# Patient Record
Sex: Male | Born: 1945
Health system: Southern US, Community
[De-identification: ages and names within clinical notes are randomized; demographics above are authoritative.]

## PROBLEM LIST (undated history)

## (undated) ENCOUNTER — Ambulatory Visit (HOSPITAL_COMMUNITY): Admission: EM

## (undated) DIAGNOSIS — G4700 Insomnia, unspecified: Secondary | ICD-10-CM

## (undated) DIAGNOSIS — K649 Unspecified hemorrhoids: Secondary | ICD-10-CM

## (undated) DIAGNOSIS — K5900 Constipation, unspecified: Secondary | ICD-10-CM

## (undated) DIAGNOSIS — R0789 Other chest pain: Secondary | ICD-10-CM

## (undated) DIAGNOSIS — K2980 Duodenitis without bleeding: Secondary | ICD-10-CM

## (undated) DIAGNOSIS — G479 Sleep disorder, unspecified: Secondary | ICD-10-CM

## (undated) DIAGNOSIS — I10 Essential (primary) hypertension: Secondary | ICD-10-CM

## (undated) DIAGNOSIS — K921 Melena: Secondary | ICD-10-CM

## (undated) DIAGNOSIS — F4310 Post-traumatic stress disorder, unspecified: Secondary | ICD-10-CM

## (undated) DIAGNOSIS — H269 Unspecified cataract: Secondary | ICD-10-CM

## (undated) DIAGNOSIS — M25519 Pain in unspecified shoulder: Secondary | ICD-10-CM

## (undated) DIAGNOSIS — Z8639 Personal history of other endocrine, nutritional and metabolic disease: Secondary | ICD-10-CM

## (undated) DIAGNOSIS — L719 Rosacea, unspecified: Secondary | ICD-10-CM

## (undated) DIAGNOSIS — Z860100 Personal history of colon polyps, unspecified: Secondary | ICD-10-CM

## (undated) DIAGNOSIS — Z87442 Personal history of urinary calculi: Secondary | ICD-10-CM

## (undated) HISTORY — DX: Personal history of other endocrine, nutritional and metabolic disease: Z86.39

## (undated) HISTORY — PX: OTHER SURGICAL HISTORY: SHX169

## (undated) HISTORY — DX: Personal history of colon polyps, unspecified: Z86.0100

## (undated) HISTORY — DX: Personal history of colonic polyps: Z86.010

## (undated) HISTORY — DX: Rosacea, unspecified: L71.9

## (undated) HISTORY — DX: Essential (primary) hypertension: I10

## (undated) HISTORY — DX: Other chest pain: R07.89

## (undated) HISTORY — DX: Unspecified cataract: H26.9

## (undated) HISTORY — DX: Personal history of urinary calculi: Z87.442

## (undated) HISTORY — DX: Melena: K92.1

## (undated) HISTORY — DX: Pain in unspecified shoulder: M25.519

## (undated) HISTORY — PX: COLONOSCOPY: SHX174

## (undated) HISTORY — DX: Unspecified hemorrhoids: K64.9

## (undated) HISTORY — DX: Constipation, unspecified: K59.00

## (undated) HISTORY — DX: Insomnia, unspecified: G47.00

## (undated) HISTORY — DX: Duodenitis without bleeding: K29.80

## (undated) HISTORY — DX: Sleep disorder, unspecified: G47.9

## (undated) HISTORY — DX: Post-traumatic stress disorder, unspecified: F43.10

---

## 1999-08-20 ENCOUNTER — Ambulatory Visit (HOSPITAL_COMMUNITY): Admission: RE | Admit: 1999-08-20 | Discharge: 1999-08-20 | Payer: Self-pay | Admitting: Internal Medicine

## 2001-03-04 ENCOUNTER — Encounter: Payer: Self-pay | Admitting: Internal Medicine

## 2001-04-27 ENCOUNTER — Emergency Department (HOSPITAL_COMMUNITY): Admission: EM | Admit: 2001-04-27 | Discharge: 2001-04-27 | Payer: Self-pay | Admitting: Emergency Medicine

## 2001-09-14 HISTORY — PX: LITHOTRIPSY: SUR834

## 2001-12-22 ENCOUNTER — Ambulatory Visit (HOSPITAL_BASED_OUTPATIENT_CLINIC_OR_DEPARTMENT_OTHER): Admission: RE | Admit: 2001-12-22 | Discharge: 2001-12-22 | Payer: Self-pay | Admitting: Urology

## 2001-12-22 ENCOUNTER — Encounter: Payer: Self-pay | Admitting: Urology

## 2001-12-25 ENCOUNTER — Emergency Department (HOSPITAL_COMMUNITY): Admission: EM | Admit: 2001-12-25 | Discharge: 2001-12-25 | Payer: Self-pay | Admitting: Emergency Medicine

## 2003-04-15 HISTORY — PX: SHOULDER SURGERY: SHX246

## 2004-10-20 ENCOUNTER — Ambulatory Visit: Payer: Self-pay | Admitting: Internal Medicine

## 2004-11-06 ENCOUNTER — Ambulatory Visit: Payer: Self-pay | Admitting: Internal Medicine

## 2005-03-20 ENCOUNTER — Encounter: Admission: RE | Admit: 2005-03-20 | Discharge: 2005-03-20 | Payer: Self-pay | Admitting: Internal Medicine

## 2007-09-15 DIAGNOSIS — R0789 Other chest pain: Secondary | ICD-10-CM

## 2007-09-15 HISTORY — DX: Other chest pain: R07.89

## 2008-03-15 ENCOUNTER — Encounter: Admission: RE | Admit: 2008-03-15 | Discharge: 2008-03-15 | Payer: Self-pay | Admitting: Internal Medicine

## 2008-03-26 ENCOUNTER — Telehealth: Payer: Self-pay | Admitting: Internal Medicine

## 2008-05-15 DIAGNOSIS — K649 Unspecified hemorrhoids: Secondary | ICD-10-CM | POA: Insufficient documentation

## 2008-05-15 DIAGNOSIS — G479 Sleep disorder, unspecified: Secondary | ICD-10-CM | POA: Insufficient documentation

## 2008-05-15 DIAGNOSIS — Z860100 Personal history of colon polyps, unspecified: Secondary | ICD-10-CM | POA: Insufficient documentation

## 2008-05-15 DIAGNOSIS — E785 Hyperlipidemia, unspecified: Secondary | ICD-10-CM

## 2008-05-15 DIAGNOSIS — K2980 Duodenitis without bleeding: Secondary | ICD-10-CM | POA: Insufficient documentation

## 2008-05-15 DIAGNOSIS — F4310 Post-traumatic stress disorder, unspecified: Secondary | ICD-10-CM

## 2008-05-15 DIAGNOSIS — K921 Melena: Secondary | ICD-10-CM | POA: Insufficient documentation

## 2008-05-15 DIAGNOSIS — Z87442 Personal history of urinary calculi: Secondary | ICD-10-CM

## 2008-05-16 ENCOUNTER — Ambulatory Visit: Payer: Self-pay | Admitting: Internal Medicine

## 2008-05-16 DIAGNOSIS — D509 Iron deficiency anemia, unspecified: Secondary | ICD-10-CM | POA: Insufficient documentation

## 2008-05-16 LAB — CONVERTED CEMR LAB
Iron: 83 ug/dL (ref 42–165)
Saturation Ratios: 22.4 % (ref 20.0–50.0)
Transferrin: 264.4 mg/dL (ref >=212.0)

## 2008-05-17 ENCOUNTER — Encounter: Payer: Self-pay | Admitting: Internal Medicine

## 2008-05-17 ENCOUNTER — Ambulatory Visit: Payer: Self-pay | Admitting: Internal Medicine

## 2008-05-22 ENCOUNTER — Encounter: Payer: Self-pay | Admitting: Internal Medicine

## 2008-06-07 ENCOUNTER — Ambulatory Visit: Payer: Self-pay | Admitting: Internal Medicine

## 2008-06-07 LAB — CONVERTED CEMR LAB
Fecal Occult Blood: NEGATIVE
OCCULT 1: NEGATIVE
OCCULT 2: NEGATIVE
OCCULT 3: NEGATIVE
OCCULT 4: NEGATIVE
OCCULT 5: NEGATIVE

## 2008-06-08 ENCOUNTER — Telehealth: Payer: Self-pay | Admitting: Internal Medicine

## 2011-01-30 NOTE — Op Note (Signed)
[facility]  Patient:    Tyler Reid, Tyler Reid Visit Number: [id number] MRN: [id number]          Service Type: NES Location: [facility] Attending Physician:  Monica Becton Dictated by:   Claudette Laws, M.D. Proc. Date: 12/22/01 Admit Date:  12/22/2001                             Operative Report  PREOPERATIVE DIAGNOSES: 1. A 9 x 4 mm proximal left ureteral calculus with renal colic. 2. Rule out distal left ureteral stone.  POSTOPERATIVE DIAGNOSIS: 1. A 9 x 4 mm proximal left ureteral calculus with renal colic. 2. Rule out distal left ureteral stone.  OPERATION:  Cystoscopy, left retrograde pyeloureterogram, and insertion of a 6 French 26 cm double-J stent.  SURGEON:  Claudette Laws, M.D.  DESCRIPTION OF PROCEDURE:  The patient was prepped and draped in the dorsal lithotomy position under LMA anesthesia.  Cystoscopy was performed.  He had a normal anterior urethra, a small nonobstructing prostate, some elevation of the posterior lip, otherwise a smooth bladder.  No tumors, no calculi, normal ureteral orifices.  DESCRIPTION OF PROCEDURE:  Initially, I passed up a .038 Glidewire through a 6 [place] open-ended ureteral catheter.  It appeared that the calcification I was concerned about over the sacrum was medial to the Glidewire.  We then took retrograde pyelogram studies showing a mild to moderate left hydroureteronephrosis.  Again, I thought the stone in question was in a medial location.  We then passed up a 6 French 26 cm double-J stent with the string attached.  This was positioned in the left renal pelvis, and the distal end was curled up in the bladder.  The string exited out the urethra.  We emptied the bladder, taped the string to the penis, and then inserted a B&O suppository per rectum for bladder spasms.  The patient was then taken back to the recovery room in stable condition for plans for lithotripsy later on in the day. Dictated by:    Claudette Laws, M.D. Attending Physician:  Monica Becton DD:  12/22/01 TD:  12/22/01 Job: [phone]

## 2011-10-19 DIAGNOSIS — I10 Essential (primary) hypertension: Secondary | ICD-10-CM | POA: Diagnosis not present

## 2011-10-19 DIAGNOSIS — R05 Cough: Secondary | ICD-10-CM | POA: Diagnosis not present

## 2011-11-06 DIAGNOSIS — J3089 Other allergic rhinitis: Secondary | ICD-10-CM | POA: Diagnosis not present

## 2011-11-06 DIAGNOSIS — K219 Gastro-esophageal reflux disease without esophagitis: Secondary | ICD-10-CM | POA: Diagnosis not present

## 2011-11-06 DIAGNOSIS — J45909 Unspecified asthma, uncomplicated: Secondary | ICD-10-CM | POA: Diagnosis not present

## 2011-11-06 DIAGNOSIS — J209 Acute bronchitis, unspecified: Secondary | ICD-10-CM | POA: Diagnosis not present

## 2011-11-06 DIAGNOSIS — R05 Cough: Secondary | ICD-10-CM | POA: Diagnosis not present

## 2011-11-24 DIAGNOSIS — D235 Other benign neoplasm of skin of trunk: Secondary | ICD-10-CM | POA: Diagnosis not present

## 2012-03-09 DIAGNOSIS — J45909 Unspecified asthma, uncomplicated: Secondary | ICD-10-CM | POA: Diagnosis not present

## 2012-03-09 DIAGNOSIS — K219 Gastro-esophageal reflux disease without esophagitis: Secondary | ICD-10-CM | POA: Diagnosis not present

## 2012-03-09 DIAGNOSIS — J3089 Other allergic rhinitis: Secondary | ICD-10-CM | POA: Diagnosis not present

## 2012-05-02 DIAGNOSIS — H524 Presbyopia: Secondary | ICD-10-CM | POA: Diagnosis not present

## 2012-05-02 DIAGNOSIS — H2510 Age-related nuclear cataract, unspecified eye: Secondary | ICD-10-CM | POA: Diagnosis not present

## 2012-05-06 DIAGNOSIS — E782 Mixed hyperlipidemia: Secondary | ICD-10-CM | POA: Diagnosis not present

## 2012-05-06 DIAGNOSIS — Z125 Encounter for screening for malignant neoplasm of prostate: Secondary | ICD-10-CM | POA: Diagnosis not present

## 2012-05-06 DIAGNOSIS — I10 Essential (primary) hypertension: Secondary | ICD-10-CM | POA: Diagnosis not present

## 2012-05-13 DIAGNOSIS — Z23 Encounter for immunization: Secondary | ICD-10-CM | POA: Diagnosis not present

## 2012-05-13 DIAGNOSIS — Z0000 Encounter for general adult medical examination without abnormal findings: Secondary | ICD-10-CM | POA: Diagnosis not present

## 2012-05-13 DIAGNOSIS — Z125 Encounter for screening for malignant neoplasm of prostate: Secondary | ICD-10-CM | POA: Diagnosis not present

## 2012-05-13 DIAGNOSIS — J45909 Unspecified asthma, uncomplicated: Secondary | ICD-10-CM | POA: Diagnosis not present

## 2012-05-13 DIAGNOSIS — F329 Major depressive disorder, single episode, unspecified: Secondary | ICD-10-CM | POA: Diagnosis not present

## 2012-05-13 DIAGNOSIS — I10 Essential (primary) hypertension: Secondary | ICD-10-CM | POA: Diagnosis not present

## 2012-05-17 DIAGNOSIS — Z1212 Encounter for screening for malignant neoplasm of rectum: Secondary | ICD-10-CM | POA: Diagnosis not present

## 2012-06-14 DIAGNOSIS — H2510 Age-related nuclear cataract, unspecified eye: Secondary | ICD-10-CM | POA: Diagnosis not present

## 2012-06-30 DIAGNOSIS — J0100 Acute maxillary sinusitis, unspecified: Secondary | ICD-10-CM | POA: Diagnosis not present

## 2012-06-30 DIAGNOSIS — J209 Acute bronchitis, unspecified: Secondary | ICD-10-CM | POA: Diagnosis not present

## 2012-07-06 DIAGNOSIS — M545 Low back pain: Secondary | ICD-10-CM | POA: Diagnosis not present

## 2012-07-12 DIAGNOSIS — M545 Low back pain: Secondary | ICD-10-CM | POA: Diagnosis not present

## 2012-08-01 DIAGNOSIS — H269 Unspecified cataract: Secondary | ICD-10-CM | POA: Diagnosis not present

## 2012-08-01 DIAGNOSIS — H2510 Age-related nuclear cataract, unspecified eye: Secondary | ICD-10-CM | POA: Diagnosis not present

## 2012-08-02 DIAGNOSIS — H2510 Age-related nuclear cataract, unspecified eye: Secondary | ICD-10-CM | POA: Diagnosis not present

## 2012-08-15 DIAGNOSIS — H269 Unspecified cataract: Secondary | ICD-10-CM | POA: Diagnosis not present

## 2012-08-15 DIAGNOSIS — H2510 Age-related nuclear cataract, unspecified eye: Secondary | ICD-10-CM | POA: Diagnosis not present

## 2012-11-19 DIAGNOSIS — J309 Allergic rhinitis, unspecified: Secondary | ICD-10-CM | POA: Diagnosis not present

## 2012-11-19 DIAGNOSIS — R1013 Epigastric pain: Secondary | ICD-10-CM | POA: Diagnosis not present

## 2012-11-19 DIAGNOSIS — J0100 Acute maxillary sinusitis, unspecified: Secondary | ICD-10-CM | POA: Diagnosis not present

## 2012-11-23 DIAGNOSIS — D235 Other benign neoplasm of skin of trunk: Secondary | ICD-10-CM | POA: Diagnosis not present

## 2012-11-29 DIAGNOSIS — H04129 Dry eye syndrome of unspecified lacrimal gland: Secondary | ICD-10-CM | POA: Diagnosis not present

## 2012-12-05 DIAGNOSIS — H04129 Dry eye syndrome of unspecified lacrimal gland: Secondary | ICD-10-CM | POA: Diagnosis not present

## 2013-01-16 DIAGNOSIS — L719 Rosacea, unspecified: Secondary | ICD-10-CM | POA: Insufficient documentation

## 2013-01-30 DIAGNOSIS — Z961 Presence of intraocular lens: Secondary | ICD-10-CM | POA: Diagnosis not present

## 2013-01-30 DIAGNOSIS — H26499 Other secondary cataract, unspecified eye: Secondary | ICD-10-CM | POA: Diagnosis not present

## 2013-01-30 DIAGNOSIS — H18419 Arcus senilis, unspecified eye: Secondary | ICD-10-CM | POA: Diagnosis not present

## 2013-02-13 DIAGNOSIS — Z961 Presence of intraocular lens: Secondary | ICD-10-CM | POA: Diagnosis not present

## 2013-02-13 DIAGNOSIS — H26499 Other secondary cataract, unspecified eye: Secondary | ICD-10-CM | POA: Diagnosis not present

## 2013-03-07 DIAGNOSIS — J3089 Other allergic rhinitis: Secondary | ICD-10-CM | POA: Diagnosis not present

## 2013-03-07 DIAGNOSIS — J45909 Unspecified asthma, uncomplicated: Secondary | ICD-10-CM | POA: Diagnosis not present

## 2013-03-07 DIAGNOSIS — K219 Gastro-esophageal reflux disease without esophagitis: Secondary | ICD-10-CM | POA: Diagnosis not present

## 2013-04-12 DIAGNOSIS — S61209A Unspecified open wound of unspecified finger without damage to nail, initial encounter: Secondary | ICD-10-CM | POA: Diagnosis not present

## 2013-04-17 DIAGNOSIS — H02839 Dermatochalasis of unspecified eye, unspecified eyelid: Secondary | ICD-10-CM | POA: Diagnosis not present

## 2013-04-27 DIAGNOSIS — H02839 Dermatochalasis of unspecified eye, unspecified eyelid: Secondary | ICD-10-CM | POA: Diagnosis not present

## 2013-05-05 DIAGNOSIS — H5340 Unspecified visual field defects: Secondary | ICD-10-CM | POA: Diagnosis not present

## 2013-05-05 DIAGNOSIS — Q388 Other congenital malformations of pharynx: Secondary | ICD-10-CM | POA: Diagnosis not present

## 2013-05-05 DIAGNOSIS — H6120 Impacted cerumen, unspecified ear: Secondary | ICD-10-CM | POA: Diagnosis not present

## 2013-05-05 DIAGNOSIS — H04129 Dry eye syndrome of unspecified lacrimal gland: Secondary | ICD-10-CM | POA: Diagnosis not present

## 2013-05-05 DIAGNOSIS — H02839 Dermatochalasis of unspecified eye, unspecified eyelid: Secondary | ICD-10-CM | POA: Diagnosis not present

## 2013-05-12 DIAGNOSIS — Z23 Encounter for immunization: Secondary | ICD-10-CM | POA: Diagnosis not present

## 2013-05-12 DIAGNOSIS — I10 Essential (primary) hypertension: Secondary | ICD-10-CM | POA: Diagnosis not present

## 2013-05-12 DIAGNOSIS — Z125 Encounter for screening for malignant neoplasm of prostate: Secondary | ICD-10-CM | POA: Diagnosis not present

## 2013-05-12 DIAGNOSIS — E782 Mixed hyperlipidemia: Secondary | ICD-10-CM | POA: Diagnosis not present

## 2013-05-15 HISTORY — PX: OTHER SURGICAL HISTORY: SHX169

## 2013-05-19 DIAGNOSIS — H02839 Dermatochalasis of unspecified eye, unspecified eyelid: Secondary | ICD-10-CM | POA: Diagnosis not present

## 2013-05-19 DIAGNOSIS — H5340 Unspecified visual field defects: Secondary | ICD-10-CM | POA: Diagnosis not present

## 2013-05-22 DIAGNOSIS — Z125 Encounter for screening for malignant neoplasm of prostate: Secondary | ICD-10-CM | POA: Diagnosis not present

## 2013-05-22 DIAGNOSIS — E291 Testicular hypofunction: Secondary | ICD-10-CM | POA: Diagnosis not present

## 2013-05-22 DIAGNOSIS — J45909 Unspecified asthma, uncomplicated: Secondary | ICD-10-CM | POA: Diagnosis not present

## 2013-05-22 DIAGNOSIS — F329 Major depressive disorder, single episode, unspecified: Secondary | ICD-10-CM | POA: Diagnosis not present

## 2013-05-22 DIAGNOSIS — Z1331 Encounter for screening for depression: Secondary | ICD-10-CM | POA: Diagnosis not present

## 2013-05-22 DIAGNOSIS — M545 Low back pain: Secondary | ICD-10-CM | POA: Diagnosis not present

## 2013-05-22 DIAGNOSIS — Z0000 Encounter for general adult medical examination without abnormal findings: Secondary | ICD-10-CM | POA: Diagnosis not present

## 2013-05-22 DIAGNOSIS — D649 Anemia, unspecified: Secondary | ICD-10-CM | POA: Diagnosis not present

## 2013-05-22 DIAGNOSIS — G4733 Obstructive sleep apnea (adult) (pediatric): Secondary | ICD-10-CM | POA: Diagnosis not present

## 2013-05-22 DIAGNOSIS — I10 Essential (primary) hypertension: Secondary | ICD-10-CM | POA: Diagnosis not present

## 2013-05-23 DIAGNOSIS — Z1212 Encounter for screening for malignant neoplasm of rectum: Secondary | ICD-10-CM | POA: Diagnosis not present

## 2013-06-14 ENCOUNTER — Telehealth: Payer: Self-pay | Admitting: Internal Medicine

## 2013-06-15 ENCOUNTER — Encounter: Payer: Self-pay | Admitting: *Deleted

## 2013-06-15 NOTE — Telephone Encounter (Signed)
Scheduled patient on 06/16/13 at 10:00 AM with Mike Gip, PA.Malachi Bonds notified and she will notified patient.

## 2013-06-16 ENCOUNTER — Ambulatory Visit (INDEPENDENT_AMBULATORY_CARE_PROVIDER_SITE_OTHER): Payer: Medicare Other | Admitting: Physician Assistant

## 2013-06-16 ENCOUNTER — Encounter: Payer: Self-pay | Admitting: Physician Assistant

## 2013-06-16 VITALS — BP 100/70 | HR 60 | Ht 70.0 in | Wt 177.5 lb

## 2013-06-16 DIAGNOSIS — Z860100 Personal history of colon polyps, unspecified: Secondary | ICD-10-CM

## 2013-06-16 DIAGNOSIS — R634 Abnormal weight loss: Secondary | ICD-10-CM | POA: Diagnosis not present

## 2013-06-16 DIAGNOSIS — R195 Other fecal abnormalities: Secondary | ICD-10-CM

## 2013-06-16 MED ORDER — MOVIPREP 100 G PO SOLR: 1.0000 | Freq: Once | ORAL | Status: DC

## 2013-06-16 NOTE — Patient Instructions (Addendum)
You have been scheduled for a colonoscopy with propofol. Please follow written instructions given to you at your visit today.  Please pick up your prep kit at the pharmacy within the next 1-3 days. [facility] [address]  If you use inhalers (even only as needed), please bring them with you on the day of your procedure.

## 2013-06-16 NOTE — Progress Notes (Signed)
Reviewed and agree. Please obtain recent labs from Dr Eloise Harman ( CBC, C-met),

## 2013-06-16 NOTE — Progress Notes (Signed)
Subjective:    Patient ID: Tyler Reid, male    DOB: 05-02-1946, 67 y.o.   MRN: [id number]  HPI  Tyler Reid is a pleasant 67 year old white male known to Dr. Vincent Gros  who is referred today by Dr. Jarome Matin regarding Hemoccult-positive stool found at the time of recent physical. Patient had previous EGD done in September of 2009 for Hemoccult-positive stool and anemia and was found to have a moderate duodenitis. Colonoscopy was done in 2006 and was a normal exam. Patient does have prior history of adenomatous colon polyps and had colonoscopy in 1996 and in 2000. He currently denies any abdominal pain. He does take omeprazole daily of 4 was felt to be reflux induced asthma symptoms. He has no complaints of dysphagia or odynophagia no heartburn or indigestion. He says he tends to stay more on the constipated side and occasionally uses a laxative. He takes magnesium supplement daily as well. He says intermittently he will see a small amount of blood just on the tissue but has never seen any blood mixed in with his bowel movements nor any melena. He has had a 10-12 pound weight loss over the past 6 months which has been unintentional and says he has been having occasional bouts of weakness as well. His appetite has been fine. He denies any regular aspirin or NSAID use He reports that he was found to have protein in his urine also with recent physical which is being followed, labs from 05/12/2013 WBC of 4.8 hemoglobin 14.4 hematocrit of 43.8 cholesterol elevated at 227    Review of Systems  Constitutional: Negative.   HENT: Negative.   Eyes: Negative.   Respiratory: Negative.   Cardiovascular: Negative.   Gastrointestinal: Positive for anal bleeding.  Endocrine: Negative.   Genitourinary: Negative.   Musculoskeletal: Negative.   Skin: Negative.   Allergic/Immunologic: Negative.   Neurological: Negative.   Hematological: Negative.   Psychiatric/Behavioral: Negative.    Outpatient Encounter  Prescriptions as of 06/16/2013  Medication Sig Dispense Refill  . BEPREVE 1.5 % SOLN       . omeprazole (PRILOSEC) 20 MG capsule       . MOVIPREP 100 G SOLR Take 1 kit (200 g total) by mouth once. "Pharmacist please use BIN: [id number] GROUP: [id number] ID: [id number] Call [phone] for pharmacy questions "Pt will save $10"  1 kit  0   No facility-administered encounter medications on file as of 06/16/2013.   No Known Allergies Patient Active Problem List   Diagnosis Date Noted  . UNSPECIFIED IRON DEFICIENCY ANEMIA 05/16/2008  . HYPERLIPIDEMIA 05/15/2008  . POSTTRAUMATIC STRESS DISORDER 05/15/2008  . HEMORRHOIDS 05/15/2008  . DUODENITIS, WITHOUT HEMORRHAGE 05/15/2008  . HEMOCCULT POSITIVE STOOL 05/15/2008  . SLEEP DISORDER 05/15/2008  . COLONIC POLYPS, ADENOMATOUS, HX OF 05/15/2008  . NEPHROLITHIASIS, HX OF 05/15/2008   History  Substance Use Topics  . Smoking status: Never Smoker   . Smokeless tobacco: Never Used  . Alcohol Use: Yes     Comment: Rare   family history includes Breast cancer in his mother; Melanoma in his father.     Objective:   Physical Exam  older white male in no acute distress, pleasant blood pressure 100/70 pulse 60 height 5 foot 10 weight 177. HEENT ;nontraumatic normocephalic EOMI PERRLA sclera anicteric, Supple; no JVD, Cardiovascular; regular rate and rhythm with S1-S2 no murmur or gallop, Pulmonary; clear bilaterally, Abdomen; soft nontender nondistended bowel sounds are active there is no palpable mass or hepatosplenomegaly, Rectal ;exam not  done he was recently documented Hemoccult positive, Extremities; no clubbing cyanosis or edema skin warm and dry, Psych ;mood and affect normal and appropriate.        Assessment & Plan:  #70  67 year old male with Hemoccult positive stool-etiology not clear Will need to rule out occult colonic or gastric lesion Last colonoscopy 2006 normal, however patient does have history of adenomatous colon polyps prior  to that Patient also has history of an active duodenitis on endoscopy September 2009 #2 weight loss #3 reflux induced asthma-controlled on omeprazole #4 hyperlipidemia #5 PTSD  Plan; Patient is scheduled for colonoscopy and EGD with Dr. Lillia Corporal were discussed in detail with the patient and he is agreeable to proceed He will continue omeprazole 40 mg by mouth every morning. Further plans pending findings of endoscopic evaluation

## 2013-06-29 ENCOUNTER — Ambulatory Visit (AMBULATORY_SURGERY_CENTER): Payer: Medicare Other | Admitting: Internal Medicine

## 2013-06-29 ENCOUNTER — Encounter: Payer: Self-pay | Admitting: Internal Medicine

## 2013-06-29 ENCOUNTER — Other Ambulatory Visit (INDEPENDENT_AMBULATORY_CARE_PROVIDER_SITE_OTHER): Payer: Medicare Other

## 2013-06-29 ENCOUNTER — Other Ambulatory Visit: Payer: Self-pay

## 2013-06-29 VITALS — BP 148/82 | HR 50 | Temp 96.5°F | Resp 23 | Ht 70.0 in | Wt 177.0 lb

## 2013-06-29 DIAGNOSIS — D1330 Benign neoplasm of unspecified part of small intestine: Secondary | ICD-10-CM

## 2013-06-29 DIAGNOSIS — R195 Other fecal abnormalities: Secondary | ICD-10-CM

## 2013-06-29 DIAGNOSIS — R1012 Left upper quadrant pain: Secondary | ICD-10-CM

## 2013-06-29 DIAGNOSIS — R634 Abnormal weight loss: Secondary | ICD-10-CM

## 2013-06-29 DIAGNOSIS — K2980 Duodenitis without bleeding: Secondary | ICD-10-CM

## 2013-06-29 DIAGNOSIS — Z86010 Personal history of colonic polyps: Secondary | ICD-10-CM | POA: Diagnosis not present

## 2013-06-29 DIAGNOSIS — E785 Hyperlipidemia, unspecified: Secondary | ICD-10-CM | POA: Diagnosis not present

## 2013-06-29 DIAGNOSIS — D126 Benign neoplasm of colon, unspecified: Secondary | ICD-10-CM | POA: Diagnosis not present

## 2013-06-29 DIAGNOSIS — Z1211 Encounter for screening for malignant neoplasm of colon: Secondary | ICD-10-CM | POA: Diagnosis not present

## 2013-06-29 LAB — CREATININE, SERUM: Creatinine, Ser: 1 mg/dL (ref 0.4–1.5)

## 2013-06-29 MED ORDER — SODIUM CHLORIDE 0.9 % IV SOLN: 500.0000 mL | INTRAVENOUS | Status: DC

## 2013-06-29 NOTE — Patient Instructions (Addendum)

## 2013-06-29 NOTE — Progress Notes (Signed)
Called to room to assist during endoscopic procedure.  Patient ID and intended procedure confirmed with present staff. Received instructions for my participation in the procedure from the performing physician.  

## 2013-06-29 NOTE — Progress Notes (Signed)
Patient did not experience any of the following events: a burn prior to discharge; a fall within the facility; wrong site/side/patient/procedure/implant event; or a hospital transfer or hospital admission upon discharge from the facility. (G8907) Patient did not have preoperative order for IV antibiotic SSI prophylaxis. (G8918)  

## 2013-06-29 NOTE — Op Note (Signed)
[facility] [address] [place], [address]   COLONOSCOPY PROCEDURE REPORT  PATIENT: Tyler, Reid  MR#: [id number] BIRTHDATE: 1946/06/21 , 67  yrs. old GENDER: Male ENDOSCOPIST: Hart Carwin, MD REFERRED XB:JYNWGN Eloise Harman, M.D. PROCEDURE DATE:  06/29/2013 PROCEDURE:   Colonoscopy with snare polypectomy First Screening Colonoscopy - Avg.  risk and is 50 yrs.  old or older - No.  Prior Negative Screening - Now for repeat screening. N/A  History of Adenoma - Now for follow-up colonoscopy & has been > or = to 3 yrs.  Yes hx of adenoma.  Has been 3 or more years since last colonoscopy.  Polyps Removed Today? Yes. ASA CLASS:   Class II INDICATIONS:heme positive stool, history of adenomatous polyp in 1992. Normal colonoscopy in 1996, 2008 2006 , hx of anal fissure MEDICATIONS: There was residual sedation effect present from prior procedure.  DESCRIPTION OF PROCEDURE:   After the risks benefits and alternatives of the procedure were thoroughly explained, informed consent was obtained.  A digital rectal exam revealed no abnormalities of the rectum.   The LB [id number]  endoscope was introduced through the anus and advanced to the cecum, which was identified by both the appendix and ileocecal valve. No adverse events experienced.   The quality of the prep was good, using MoviPrep  The instrument was then slowly withdrawn as the colon was fully examined.      COLON FINDINGS: Two polypoid shaped sessile polyps ranging between 3-18mm in size were found in the sigmoid colon.at 20 and 60  cm.  A polypectomy was performed with a cold snare.  The resection was complete and the polyp tissue was completely retrieved. Retroflexed views revealed no abnormalities. The time to cecum=6 minutes 14 seconds.  Withdrawal time=7 minutes 28 seconds.  The scope was withdrawn and the procedure completed.There was no evidence of anal fissure COMPLICATIONS: There were no  complications.  ENDOSCOPIC IMPRESSION: Two sessile polyps ranging between 3-98mm in size were found in the sigmoid colon; polypectomy was performed with a cold snare ,nothing to account for heme positive stool. Duodenitis may be the cause although there is no evidence of active bleeding, the polyps are not likely causing occult GI blood loss, anal or rectal source may be a possible cause for heme positive stool  RECOMMENDATIONS: 1.  Await pathology results 2.  High fiber diet 3.   follow stool Hemoccults and hemoglobin 4.   recall colonoscopy pending biopsy results   eSigned:  Hart Carwin, MD 06/29/2013 12:10 PM   cc:   PATIENT NAME:  Tyler, Reid MR#: [id number]

## 2013-06-29 NOTE — Op Note (Signed)
[facility] [address] [place], [address]   ENDOSCOPY PROCEDURE REPORT  PATIENT: Tyler Reid, Tyler Reid  MR#: [id number] BIRTHDATE: 1946-03-06 , 67  yrs. old GENDER: Male ENDOSCOPIST: Hart Carwin, MD REFERRED BY:  Jarome Matin, M.D. PROCEDURE DATE:  06/29/2013 PROCEDURE:  EGD w/ biopsy ASA CLASS:     Class II INDICATIONS:  Heme positive stool. . EGD 2009- duodenitis MEDICATIONS: MAC sedation, administered by CRNA and propofol (Diprivan) 250mg  IV TOPICAL ANESTHETIC: Cetacaine Spray  DESCRIPTION OF PROCEDURE: After the risks benefits and alternatives of the procedure were thoroughly explained, informed consent was obtained.  The LB [id number] endoscope was introduced through the mouth and advanced to the second portion of the duodenum. Without limitations.  The instrument was slowly withdrawn as the mucosa was fully examined.      Esophagua:[  proximal,l mid and distal esophageal mucosa was normal. The Z line was unremarkable. There was no stricture or hiatal hernia Stomach: Gastric folds  were normal. There was no gastritis. The gastric antrum and pyloric outlet was normal.retroflexion of the endoscope leaving the revealed normal fundus and cardia. Duodenum: there was  erythema and hyperemia which extended into the descending duodenum. Biopsies were taken to rule out duodenitis there was no friability or bleeding unremarkable. Duodenum: There was mild inflammation of the duodenal bulb       The scope was then withdrawn from the patient and the procedure completed.  COMPLICATIONS: There were no complications. ENDOSCOPIC IMPRESSION: mild duodenitis in duodenal bulb. Status post biopsies. No evidence of active bleeding RECOMMENDATIONS: 1.  Await pathology results 2.  Continue PPI 3.proceed with colonoscopy REPEAT EXAM: pending biopsy results  eSigned:  Hart Carwin, MD 06/29/2013 12:02 PM   CC:  PATIENT NAME:  [name], [name] MR#:  [id number]

## 2013-06-30 ENCOUNTER — Telehealth: Payer: Self-pay | Admitting: *Deleted

## 2013-06-30 ENCOUNTER — Ambulatory Visit (INDEPENDENT_AMBULATORY_CARE_PROVIDER_SITE_OTHER)
Admission: RE | Admit: 2013-06-30 | Discharge: 2013-06-30 | Disposition: A | Discharge status: Home / Self Care | Payer: Medicare Other | Source: Ambulatory Visit | Attending: Internal Medicine | Admitting: Internal Medicine

## 2013-06-30 DIAGNOSIS — R1012 Left upper quadrant pain: Secondary | ICD-10-CM

## 2013-06-30 DIAGNOSIS — R195 Other fecal abnormalities: Secondary | ICD-10-CM

## 2013-06-30 DIAGNOSIS — R634 Abnormal weight loss: Secondary | ICD-10-CM | POA: Diagnosis not present

## 2013-06-30 DIAGNOSIS — N200 Calculus of kidney: Secondary | ICD-10-CM | POA: Diagnosis not present

## 2013-06-30 MED ORDER — IOHEXOL 300 MG/ML  SOLN
100.0000 mL | Freq: Once | INTRAMUSCULAR | Status: AC | PRN
  Administered 2013-06-30: 100 mL via INTRAVENOUS

## 2013-06-30 NOTE — Telephone Encounter (Signed)
  Follow up Call-  Call back number 06/29/2013  Post procedure Call Back phone  # [phone]  Permission to leave phone message Yes     Patient questions:  Do you have a fever, pain , or abdominal swelling? no Pain Score  0 *  Have you tolerated food without any problems? yes  Have you been able to return to your normal activities? yes  Do you have any questions about your discharge instructions: Diet   no Medications  no Follow up visit  no  Do you have questions or concerns about your Care? no  Actions: * If pain score is 4 or above: No action needed, pain <4.

## 2013-07-03 ENCOUNTER — Other Ambulatory Visit: Payer: Self-pay | Admitting: *Deleted

## 2013-07-03 DIAGNOSIS — R195 Other fecal abnormalities: Secondary | ICD-10-CM

## 2013-07-04 ENCOUNTER — Encounter: Payer: Self-pay | Admitting: Internal Medicine

## 2013-07-21 ENCOUNTER — Other Ambulatory Visit (INDEPENDENT_AMBULATORY_CARE_PROVIDER_SITE_OTHER): Payer: Medicare Other

## 2013-07-21 ENCOUNTER — Telehealth: Payer: Self-pay | Admitting: Internal Medicine

## 2013-07-21 DIAGNOSIS — R195 Other fecal abnormalities: Secondary | ICD-10-CM | POA: Diagnosis not present

## 2013-07-21 LAB — HEMOCCULT SLIDES (X 3 CARDS)
Fecal Occult Blood: NEGATIVE
OCCULT 1: NEGATIVE
OCCULT 3: NEGATIVE
OCCULT 5: NEGATIVE

## 2013-07-21 NOTE — Telephone Encounter (Signed)
Spoke with patient and he asked for lab results from 06/29/13. Bun and Creatinine were normal. He also states he mailed his stool cards about a week ago. Lab has not received these yet. He will call next week to see if the cards have arrived.

## 2013-07-25 ENCOUNTER — Other Ambulatory Visit: Payer: Self-pay | Admitting: *Deleted

## 2013-07-25 DIAGNOSIS — K921 Melena: Secondary | ICD-10-CM

## 2013-08-09 DIAGNOSIS — H66009 Acute suppurative otitis media without spontaneous rupture of ear drum, unspecified ear: Secondary | ICD-10-CM | POA: Diagnosis not present

## 2013-08-09 DIAGNOSIS — J019 Acute sinusitis, unspecified: Secondary | ICD-10-CM | POA: Diagnosis not present

## 2013-10-26 ENCOUNTER — Telehealth: Payer: Self-pay | Admitting: *Deleted

## 2013-10-26 NOTE — Telephone Encounter (Signed)
Patient will come for labs next week. 

## 2013-10-26 NOTE — Telephone Encounter (Signed)
Message copied by Hulan Saas on Thu Oct 26, 2013 10:13 AM ------      Message from: Hulan Saas      Created: Tue Jul 25, 2013 10:21 AM       Call and remind due for CBC on 10/30/12 DB. Lab in EPIC ------

## 2013-11-01 ENCOUNTER — Other Ambulatory Visit (INDEPENDENT_AMBULATORY_CARE_PROVIDER_SITE_OTHER): Payer: Medicare Other

## 2013-11-01 DIAGNOSIS — K921 Melena: Secondary | ICD-10-CM | POA: Diagnosis not present

## 2013-11-01 LAB — CBC WITH DIFFERENTIAL/PLATELET
Basophils Absolute: 0 10*3/uL (ref 0.0–0.1)
Basophils Relative: 0.5 % (ref 0.0–3.0)
EOS ABS: 0.2 10*3/uL (ref 0.0–0.7)
Eosinophils Relative: 3.4 % (ref 0.0–5.0)
HCT: 46.3 % (ref 39.0–52.0)
HEMOGLOBIN: 15.3 g/dL (ref 13.0–17.0)
Lymphocytes Relative: 33.5 % (ref 12.0–46.0)
Lymphs Abs: 1.5 10*3/uL (ref 0.7–4.0)
MCHC: 33 g/dL (ref 30.0–36.0)
MCV: 95.3 fl (ref 78.0–100.0)
MONO ABS: 0.2 10*3/uL (ref 0.1–1.0)
Monocytes Relative: 3.8 % (ref 3.0–12.0)
NEUTROS ABS: 2.7 10*3/uL (ref 1.4–7.7)
Neutrophils Relative %: 58.8 % (ref 43.0–77.0)
Platelets: 178 10*3/uL (ref 150.0–400.0)
RBC: 4.86 Mil/uL (ref 4.22–5.81)
RDW: 13.6 % (ref 11.5–14.6)
WBC: 4.5 10*3/uL (ref 4.5–10.5)

## 2013-11-22 DIAGNOSIS — R809 Proteinuria, unspecified: Secondary | ICD-10-CM | POA: Diagnosis not present

## 2013-11-22 DIAGNOSIS — N200 Calculus of kidney: Secondary | ICD-10-CM | POA: Diagnosis not present

## 2013-11-22 DIAGNOSIS — F329 Major depressive disorder, single episode, unspecified: Secondary | ICD-10-CM | POA: Diagnosis not present

## 2013-11-22 DIAGNOSIS — Z1331 Encounter for screening for depression: Secondary | ICD-10-CM | POA: Diagnosis not present

## 2013-11-22 DIAGNOSIS — I10 Essential (primary) hypertension: Secondary | ICD-10-CM | POA: Diagnosis not present

## 2013-11-22 DIAGNOSIS — E291 Testicular hypofunction: Secondary | ICD-10-CM | POA: Diagnosis not present

## 2013-11-22 DIAGNOSIS — F3289 Other specified depressive episodes: Secondary | ICD-10-CM | POA: Diagnosis not present

## 2013-12-01 DIAGNOSIS — L821 Other seborrheic keratosis: Secondary | ICD-10-CM | POA: Diagnosis not present

## 2013-12-01 DIAGNOSIS — D235 Other benign neoplasm of skin of trunk: Secondary | ICD-10-CM | POA: Diagnosis not present

## 2014-03-20 DIAGNOSIS — K219 Gastro-esophageal reflux disease without esophagitis: Secondary | ICD-10-CM | POA: Diagnosis not present

## 2014-03-20 DIAGNOSIS — J45909 Unspecified asthma, uncomplicated: Secondary | ICD-10-CM | POA: Diagnosis not present

## 2014-03-20 DIAGNOSIS — J3089 Other allergic rhinitis: Secondary | ICD-10-CM | POA: Diagnosis not present

## 2014-05-01 DIAGNOSIS — N3090 Cystitis, unspecified without hematuria: Secondary | ICD-10-CM | POA: Diagnosis not present

## 2014-05-01 DIAGNOSIS — N3000 Acute cystitis without hematuria: Secondary | ICD-10-CM | POA: Diagnosis not present

## 2014-05-15 HISTORY — PX: OTHER SURGICAL HISTORY: SHX169

## 2014-05-28 DIAGNOSIS — R809 Proteinuria, unspecified: Secondary | ICD-10-CM | POA: Diagnosis not present

## 2014-05-28 DIAGNOSIS — Z125 Encounter for screening for malignant neoplasm of prostate: Secondary | ICD-10-CM | POA: Diagnosis not present

## 2014-05-28 DIAGNOSIS — E782 Mixed hyperlipidemia: Secondary | ICD-10-CM | POA: Diagnosis not present

## 2014-05-28 DIAGNOSIS — R82998 Other abnormal findings in urine: Secondary | ICD-10-CM | POA: Diagnosis not present

## 2014-05-28 DIAGNOSIS — I10 Essential (primary) hypertension: Secondary | ICD-10-CM | POA: Diagnosis not present

## 2014-06-04 DIAGNOSIS — J45909 Unspecified asthma, uncomplicated: Secondary | ICD-10-CM | POA: Diagnosis not present

## 2014-06-04 DIAGNOSIS — Z23 Encounter for immunization: Secondary | ICD-10-CM | POA: Diagnosis not present

## 2014-06-04 DIAGNOSIS — Z1212 Encounter for screening for malignant neoplasm of rectum: Secondary | ICD-10-CM | POA: Diagnosis not present

## 2014-06-04 DIAGNOSIS — G4733 Obstructive sleep apnea (adult) (pediatric): Secondary | ICD-10-CM | POA: Diagnosis not present

## 2014-06-04 DIAGNOSIS — N200 Calculus of kidney: Secondary | ICD-10-CM | POA: Diagnosis not present

## 2014-06-04 DIAGNOSIS — I10 Essential (primary) hypertension: Secondary | ICD-10-CM | POA: Diagnosis not present

## 2014-06-04 DIAGNOSIS — E782 Mixed hyperlipidemia: Secondary | ICD-10-CM | POA: Diagnosis not present

## 2014-06-04 DIAGNOSIS — Z0000 Encounter for general adult medical examination without abnormal findings: Secondary | ICD-10-CM | POA: Diagnosis not present

## 2014-06-04 DIAGNOSIS — E291 Testicular hypofunction: Secondary | ICD-10-CM | POA: Diagnosis not present

## 2014-06-11 DIAGNOSIS — J309 Allergic rhinitis, unspecified: Secondary | ICD-10-CM | POA: Diagnosis not present

## 2014-06-11 DIAGNOSIS — J209 Acute bronchitis, unspecified: Secondary | ICD-10-CM | POA: Diagnosis not present

## 2014-06-13 DIAGNOSIS — Z961 Presence of intraocular lens: Secondary | ICD-10-CM | POA: Diagnosis not present

## 2014-06-13 DIAGNOSIS — H35379 Puckering of macula, unspecified eye: Secondary | ICD-10-CM | POA: Diagnosis not present

## 2014-06-13 DIAGNOSIS — I10 Essential (primary) hypertension: Secondary | ICD-10-CM | POA: Diagnosis not present

## 2014-06-13 DIAGNOSIS — H43819 Vitreous degeneration, unspecified eye: Secondary | ICD-10-CM | POA: Diagnosis not present

## 2014-06-25 DIAGNOSIS — H35371 Puckering of macula, right eye: Secondary | ICD-10-CM | POA: Diagnosis not present

## 2014-07-04 DIAGNOSIS — H35371 Puckering of macula, right eye: Secondary | ICD-10-CM | POA: Diagnosis not present

## 2014-07-12 DIAGNOSIS — H35371 Puckering of macula, right eye: Secondary | ICD-10-CM | POA: Diagnosis not present

## 2014-08-23 DIAGNOSIS — H35371 Puckering of macula, right eye: Secondary | ICD-10-CM | POA: Diagnosis not present

## 2014-09-12 DIAGNOSIS — L259 Unspecified contact dermatitis, unspecified cause: Secondary | ICD-10-CM | POA: Diagnosis not present

## 2014-09-19 DIAGNOSIS — L859 Epidermal thickening, unspecified: Secondary | ICD-10-CM | POA: Diagnosis not present

## 2014-09-19 DIAGNOSIS — L821 Other seborrheic keratosis: Secondary | ICD-10-CM | POA: Diagnosis not present

## 2014-09-19 DIAGNOSIS — L089 Local infection of the skin and subcutaneous tissue, unspecified: Secondary | ICD-10-CM | POA: Diagnosis not present

## 2014-09-19 DIAGNOSIS — B353 Tinea pedis: Secondary | ICD-10-CM | POA: Diagnosis not present

## 2014-09-26 DIAGNOSIS — Z6826 Body mass index (BMI) 26.0-26.9, adult: Secondary | ICD-10-CM | POA: Diagnosis not present

## 2014-09-26 DIAGNOSIS — G4733 Obstructive sleep apnea (adult) (pediatric): Secondary | ICD-10-CM | POA: Diagnosis not present

## 2014-09-26 DIAGNOSIS — I10 Essential (primary) hypertension: Secondary | ICD-10-CM | POA: Diagnosis not present

## 2014-10-03 ENCOUNTER — Telehealth: Payer: Self-pay | Admitting: Neurology

## 2014-10-03 NOTE — Telephone Encounter (Signed)
Dr. Philip Aspen is referring this patient to a new sleep provider to check the efficacy of his oral appliance.  The patient has no desire to follow again with Dr. [name].  He has not been seen in the office since 2011 and he will be considered a new patient.  Wanted to get the okay before he was placed on your schedule.  According to [employer], he had apparently been seeing Dr. [name] for some time before he stopped.

## 2014-10-11 NOTE — Telephone Encounter (Signed)
Per referring provider's request, appt scheduled.

## 2014-10-13 ENCOUNTER — Encounter (HOSPITAL_COMMUNITY): Payer: Self-pay | Admitting: *Deleted

## 2014-10-13 DIAGNOSIS — Z8719 Personal history of other diseases of the digestive system: Secondary | ICD-10-CM | POA: Insufficient documentation

## 2014-10-13 DIAGNOSIS — R0602 Shortness of breath: Secondary | ICD-10-CM | POA: Insufficient documentation

## 2014-10-13 DIAGNOSIS — Z8639 Personal history of other endocrine, nutritional and metabolic disease: Secondary | ICD-10-CM | POA: Diagnosis not present

## 2014-10-13 DIAGNOSIS — Z87442 Personal history of urinary calculi: Secondary | ICD-10-CM | POA: Diagnosis not present

## 2014-10-13 DIAGNOSIS — I10 Essential (primary) hypertension: Secondary | ICD-10-CM | POA: Insufficient documentation

## 2014-10-13 DIAGNOSIS — Z8669 Personal history of other diseases of the nervous system and sense organs: Secondary | ICD-10-CM | POA: Insufficient documentation

## 2014-10-13 DIAGNOSIS — Z86010 Personal history of colonic polyps: Secondary | ICD-10-CM | POA: Diagnosis not present

## 2014-10-13 DIAGNOSIS — Z79899 Other long term (current) drug therapy: Secondary | ICD-10-CM | POA: Insufficient documentation

## 2014-10-13 DIAGNOSIS — R030 Elevated blood-pressure reading, without diagnosis of hypertension: Secondary | ICD-10-CM | POA: Diagnosis not present

## 2014-10-13 DIAGNOSIS — Z8659 Personal history of other mental and behavioral disorders: Secondary | ICD-10-CM | POA: Insufficient documentation

## 2014-10-13 DIAGNOSIS — R079 Chest pain, unspecified: Secondary | ICD-10-CM | POA: Diagnosis not present

## 2014-10-13 NOTE — ED Notes (Signed)
The pts bp has been high since yesterday.  It has been high today and he took his wifes sl nitro and dropped his bp to 114.  No chest pain at present

## 2014-10-14 ENCOUNTER — Emergency Department (HOSPITAL_COMMUNITY)
Admission: EM | Admit: 2014-10-14 | Discharge: 2014-10-14 | Disposition: A | Discharge status: Home / Self Care | Payer: Medicare Other | Attending: Emergency Medicine | Admitting: Emergency Medicine

## 2014-10-14 ENCOUNTER — Emergency Department (HOSPITAL_COMMUNITY): Payer: Medicare Other

## 2014-10-14 DIAGNOSIS — R030 Elevated blood-pressure reading, without diagnosis of hypertension: Secondary | ICD-10-CM

## 2014-10-14 DIAGNOSIS — IMO0001 Reserved for inherently not codable concepts without codable children: Secondary | ICD-10-CM

## 2014-10-14 DIAGNOSIS — R079 Chest pain, unspecified: Secondary | ICD-10-CM | POA: Diagnosis not present

## 2014-10-14 DIAGNOSIS — I10 Essential (primary) hypertension: Secondary | ICD-10-CM | POA: Diagnosis not present

## 2014-10-14 LAB — BASIC METABOLIC PANEL
Anion gap: 8 (ref 5–15)
BUN: 16 mg/dL (ref 6–23)
CHLORIDE: 99 mmol/L (ref 96–112)
CO2: 29 mmol/L (ref 19–32)
Calcium: 9.4 mg/dL (ref 8.4–10.5)
Creatinine, Ser: 1.15 mg/dL (ref 0.50–1.35)
GFR calc Af Amer: 73 mL/min — ABNORMAL LOW (ref >90)
GFR calc non Af Amer: 63 mL/min — ABNORMAL LOW (ref >90)
Glucose, Bld: 100 mg/dL — ABNORMAL HIGH (ref 70–99)
POTASSIUM: 4 mmol/L (ref 3.5–5.1)
Sodium: 136 mmol/L (ref 135–145)

## 2014-10-14 LAB — MAGNESIUM: MAGNESIUM: 2.2 mg/dL (ref 1.5–2.5)

## 2014-10-14 LAB — CBC
HEMATOCRIT: 42.3 % (ref 39.0–52.0)
Hemoglobin: 14.7 g/dL (ref 13.0–17.0)
MCH: 32.6 pg (ref 26.0–34.0)
MCHC: 34.8 g/dL (ref 30.0–36.0)
MCV: 93.8 fL (ref 78.0–100.0)
Platelets: 210 10*3/uL (ref 150–400)
RBC: 4.51 MIL/uL (ref 4.22–5.81)
RDW: 12.2 % (ref 11.5–15.5)
WBC: 5.1 10*3/uL (ref 4.0–10.5)

## 2014-10-14 LAB — TROPONIN I: Troponin I: <0.03 ng/mL (ref <0.031)

## 2014-10-14 MED ORDER — CLONIDINE HCL 0.1 MG PO TABS: 0.1000 mg | ORAL_TABLET | Freq: Two times a day (BID) | ORAL | Status: DC | PRN

## 2014-10-14 MED ORDER — CLONIDINE HCL 0.2 MG PO TABS
0.2000 mg | ORAL_TABLET | Freq: Once | ORAL | Status: DC
  Filled 2014-10-14: qty 1

## 2014-10-14 NOTE — ED Notes (Signed)
Lying on stretcher at this time with no distress noted or complaints voiced.  Wife at the bedside.  Encouraged to call for assistance as needed.

## 2014-10-14 NOTE — ED Provider Notes (Signed)
CSN: [id number]     Arrival date & time 10/13/14  2329 History  This chart was scribed for Julianne Rice, MD by Molli Posey, ED Scribe. This patient was seen in room A08C/A08C and the patient's care was started 12:57 AM.      Chief Complaint  Patient presents with  . Hypertension   The history is provided by the patient. No language interpreter was used.   HPI Comments: Tyler Reid is a 69 y.o. male with a history of sleep apnea who presents to the Emergency Department complaining of hypertension since yesterday. Pt states he took his BP around 10PM last night and it read 210/115. He states that he took his wife's sl nitro which dropped his BP to 114. Pt states that his doctor put him on a calcium based medication for his HTN 3 weeks ago. Pt states that he was previously on BP medication this past year but gradually decreased the dosage after his BP began to improve when he started using a sleep apnea machine. He states he was instructed to keep reducing his medication until he no longer needed it. He reports that he started having some elevated BP in November. He states that he feels warm but normal currently. He states that he typically drinks 2-3 cups of coffee daily. He denies SOB, CP, nausea and vomiting.   Past Medical History  Diagnosis Date  . Blood in stool   . Sleep disorder   . Posttraumatic stress disorder   . Hx of hyperlipidemia   . Duodenitis without mention of hemorrhage   . Nephrolithiasis   . Hemorrhoids   . Personal history of colonic polyps    Past Surgical History  Procedure Laterality Date  . Cleft palate reconstruction      31 months of age x 4  . Lithotripsy      x 2  . Bilateral lasik surgery     Family History  Problem Relation Age of Onset  . Breast cancer Mother   . Melanoma Father    History  Substance Use Topics  . Smoking status: Never Smoker   . Smokeless tobacco: Never Used  . Alcohol Use: Yes     Comment: Rare    Review of Systems   Constitutional: Negative for fever and chills.       Hypertension  Eyes: Negative for visual disturbance.  Respiratory: Positive for shortness of breath.   Cardiovascular: Negative for chest pain, palpitations and leg swelling.  Gastrointestinal: Negative for nausea, vomiting and abdominal pain.  Musculoskeletal: Negative for back pain, neck pain and neck stiffness.  Skin: Negative for rash and wound.  Neurological: Negative for dizziness, weakness, light-headedness, numbness and headaches.  All other systems reviewed and are negative.   Allergies  Niacin and related and Statins  Home Medications   Prior to Admission medications   Medication Sig Start Date End Date Taking? Authorizing Provider  albuterol (PROVENTIL HFA;VENTOLIN HFA) 108 (90 BASE) MCG/ACT inhaler Inhale 1 puff into the lungs every 6 (six) hours as needed for wheezing or shortness of breath.   Yes Historical Provider, MD  Multiple Vitamin (MULTIVITAMIN WITH MINERALS) TABS tablet Take 1 tablet by mouth daily.   Yes Historical Provider, MD  terbinafine (LAMISIL) 250 MG tablet Take 250 mg by mouth daily.   Yes Historical Provider, MD  verapamil (CALAN-SR) 120 MG CR tablet Take 120 mg by mouth daily.   Yes Historical Provider, MD  cloNIDine (CATAPRES) 0.1 MG tablet Take 1 tablet (  0.1 mg total) by mouth 2 (two) times daily as needed (SBP >180). 10/14/14   Julianne Rice, MD   BP 151/94 mmHg  Pulse 58  Temp(Src) 97.8 F (36.6 C) (Oral)  Resp 13  SpO2 98% Physical Exam  Constitutional: He is oriented to person, place, and time. He appears well-developed and well-nourished. No distress.  HENT:  Head: Normocephalic and atraumatic.  Mouth/Throat: Oropharynx is clear and moist.  Eyes: EOM are normal. Pupils are equal, round, and reactive to light.  Neck: Normal range of motion. Neck supple.  Cardiovascular: Normal rate and regular rhythm.   Pulmonary/Chest: Effort normal and breath sounds normal. No respiratory distress.  He has no wheezes. He has no rales.  Abdominal: Soft. Bowel sounds are normal. He exhibits no distension and no mass. There is no tenderness. There is no rebound.  Musculoskeletal: Normal range of motion. He exhibits no edema or tenderness.  Neurological: He is alert and oriented to person, place, and time.  Patient is alert and oriented x3 with clear, goal oriented speech. Patient has 5/5 motor in all extremities. Sensation is intact to light touch. Bilateral finger-to-nose is normal with no signs of dysmetria. Patient has a normal gait and walks without assistance.  Skin: Skin is warm and dry. No rash noted. No erythema.  Psychiatric: He has a normal mood and affect. His behavior is normal.  Nursing note and vitals reviewed.   ED Course  Procedures   DIAGNOSTIC STUDIES: Oxygen Saturation is 98% on RA, normal by my interpretation.    COORDINATION OF CARE: 1:05 AM Discussed treatment plan with pt at bedside and pt agreed to plan.   Labs Review Labs Reviewed  BASIC METABOLIC PANEL - Abnormal; Notable for the following:    Glucose, Bld 100 (*)    GFR calc non Af Amer 63 (*)    GFR calc Af Amer 73 (*)    All other components within normal limits  CBC  TROPONIN I  MAGNESIUM    Imaging Review Dg Chest 2 View  10/14/2014   CLINICAL DATA:  Acute on chronic hypertension, LEFT chest pain.  EXAM: CHEST  2 VIEW  COMPARISON:  None.  FINDINGS: Cardiomediastinal silhouette is unremarkable. Linear densities LEFT lung base. LEFT upper lobe granuloma. RIGHT lung base granuloma. No pleural effusions or focal consolidations. No pneumothorax. Soft tissue planes and included osseous structures are nonsuspicious.  IMPRESSION: LEFT lung base atelectasis/scar.   Electronically Signed   By: Elon Alas   On: 10/14/2014 00:28     EKG Interpretation None      MDM   Final diagnoses:  Elevated blood pressure    I personally performed the services described in this documentation, which was  scribed in my presence. The recorded information has been reviewed and is accurate.  Patient's blood pressure resolved spontaneously. Still has mildly elevated blood pressure. Normal neurologic exam. We'll give clonidine when necessary for blood pressures greater than 180. Patient is advised follow-up with his primary care physician for medication adjustment as needed. Return precautions given.     Julianne Rice, MD 10/14/14 [phone]

## 2014-10-14 NOTE — Discharge Instructions (Signed)
Call and make an appointment to follow-up with your primary doctor. Return immediately for persistently elevated blood pressure, vision changes, focal weakness, persistent vomiting or chest pain or any concerns.  How to Take Your Blood Pressure HOW DO I GET A BLOOD PRESSURE MACHINE?  You can buy an electronic home blood pressure machine at your local pharmacy. Insurance will sometimes cover the cost if you have a prescription.  Ask your doctor what type of machine is best for you. There are different machines for your arm and your wrist.  If you decide to buy a machine to check your blood pressure on your arm, first check the size of your arm so you can buy the right size cuff. To check the size of your arm:   Use a measuring tape that shows both inches and centimeters.   Wrap the measuring tape around the upper-middle part of your arm. You may need someone to help you measure.   Write down your arm measurement in both inches and centimeters.   To measure your blood pressure correctly, it is important to have the right size cuff.   If your arm is up to 13 inches (up to 34 centimeters), get an adult cuff size.  If your arm is 13 to 17 inches (35 to 44 centimeters), get a large adult cuff size.    If your arm is 17 to 20 inches (45 to 52 centimeters), get an adult thigh cuff.  WHAT DO THE NUMBERS MEAN?   There are two numbers that make up your blood pressure. For example: 120/80.  The first number (120 in our example) is called the "systolic pressure." It is a measure of the pressure in your blood vessels when your heart is pumping blood.  The second number (80 in our example) is called the "diastolic pressure." It is a measure of the pressure in your blood vessels when your heart is resting between beats.  Your doctor will tell you what your blood pressure should be. WHAT SHOULD I DO BEFORE I CHECK MY BLOOD PRESSURE?   Try to rest or relax for at least 30 minutes before you  check your blood pressure.  Do not smoke.  Do not have any drinks with caffeine, such as:  Soda.  Coffee.  Tea.  Check your blood pressure in a quiet room.  Sit down and stretch out your arm on a table. Keep your arm at about the level of your heart. Let your arm relax.  Make sure that your legs are not crossed. HOW DO I CHECK MY BLOOD PRESSURE?  Follow the directions that came with your machine.  Make sure you remove any tight-fighting clothing from your arm or wrist. Wrap the cuff around your upper arm or wrist. You should be able to fit a finger between the cuff and your arm. If you cannot fit a finger between the cuff and your arm, it is too tight and should be removed and rewrapped.  Some units require you to manually pump up the arm cuff.  Automatic units inflate the cuff when you press a button.  Cuff deflation is automatic in both models.  After the cuff is inflated, the unit measures your blood pressure and pulse. The readings are shown on a monitor. Hold still and breathe normally while the cuff is inflated.  Getting a reading takes less than a minute.  Some models store readings in a memory. Some provide a printout of readings. If your machine does not  store your readings, keep a written record.  Take readings with you to your next visit with your doctor. Document Released: 08/13/2008 Document Revised: 01/15/2014 Document Reviewed: 10/26/2013 [facility] Patient Information 2015 [place], [place]. This information is not intended to replace advice given to you by your health care provider. Make sure you discuss any questions you have with your health care provider.   Hypertension Hypertension, commonly called high blood pressure, is when the force of blood pumping through your arteries is too strong. Your arteries are the blood vessels that carry blood from your heart throughout your body. A blood pressure reading consists of a higher number over a lower number, such as  110/72. The higher number (systolic) is the pressure inside your arteries when your heart pumps. The lower number (diastolic) is the pressure inside your arteries when your heart relaxes. Ideally you want your blood pressure below 120/80. Hypertension forces your heart to work harder to pump blood. Your arteries may become narrow or stiff. Having hypertension puts you at risk for heart disease, stroke, and other problems.  RISK FACTORS Some risk factors for high blood pressure are controllable. Others are not.  Risk factors you cannot control include:   Race. You may be at higher risk if you are African American.  Age. Risk increases with age.  Gender. Men are at higher risk than women before age 48 years. After age 43, women are at higher risk than men. Risk factors you can control include:  Not getting enough exercise or physical activity.  Being overweight.  Getting too much fat, sugar, calories, or salt in your diet.  Drinking too much alcohol. SIGNS AND SYMPTOMS Hypertension does not usually cause signs or symptoms. Extremely high blood pressure (hypertensive crisis) may cause headache, anxiety, shortness of breath, and nosebleed. DIAGNOSIS  To check if you have hypertension, your health care provider will measure your blood pressure while you are seated, with your arm held at the level of your heart. It should be measured at least twice using the same arm. Certain conditions can cause a difference in blood pressure between your right and left arms. A blood pressure reading that is higher than normal on one occasion does not mean that you need treatment. If one blood pressure reading is high, ask your health care provider about having it checked again. TREATMENT  Treating high blood pressure includes making lifestyle changes and possibly taking medicine. Living a healthy lifestyle can help lower high blood pressure. You may need to change some of your habits. Lifestyle changes may  include:  Following the DASH diet. This diet is high in fruits, vegetables, and whole grains. It is low in salt, red meat, and added sugars.  Getting at least 2 hours of brisk physical activity every week.  Losing weight if necessary.  Not smoking.  Limiting alcoholic beverages.  Learning ways to reduce stress. If lifestyle changes are not enough to get your blood pressure under control, your health care provider may prescribe medicine. You may need to take more than one. Work closely with your health care provider to understand the risks and benefits. HOME CARE INSTRUCTIONS  Have your blood pressure rechecked as directed by your health care provider.   Take medicines only as directed by your health care provider. Follow the directions carefully. Blood pressure medicines must be taken as prescribed. The medicine does not work as well when you skip doses. Skipping doses also puts you at risk for problems.   Do not smoke.  Monitor your blood pressure at home as directed by your health care provider. SEEK MEDICAL CARE IF:   You think you are having a reaction to medicines taken.  You have recurrent headaches or feel dizzy.  You have swelling in your ankles.  You have trouble with your vision. SEEK IMMEDIATE MEDICAL CARE IF:  You develop a severe headache or confusion.  You have unusual weakness, numbness, or feel faint.  You have severe chest or abdominal pain.  You vomit repeatedly.  You have trouble breathing. MAKE SURE YOU:   Understand these instructions.  Will watch your condition.  Will get help right away if you are not doing well or get worse. Document Released: 08/31/2005 Document Revised: 01/15/2014 Document Reviewed: 06/23/2013 [facility] Patient Information 2015 [place], [place]. This information is not intended to replace advice given to you by your health care provider. Make sure you discuss any questions you have with your health care provider.

## 2014-10-17 ENCOUNTER — Encounter: Payer: Self-pay | Admitting: Neurology

## 2014-10-18 ENCOUNTER — Ambulatory Visit (INDEPENDENT_AMBULATORY_CARE_PROVIDER_SITE_OTHER): Payer: Medicare Other | Admitting: Neurology

## 2014-10-18 ENCOUNTER — Encounter: Payer: Self-pay | Admitting: Neurology

## 2014-10-18 VITALS — BP 134/79 | HR 63 | Temp 97.0°F | Resp 16 | Ht 70.0 in | Wt 179.0 lb

## 2014-10-18 DIAGNOSIS — G4733 Obstructive sleep apnea (adult) (pediatric): Secondary | ICD-10-CM

## 2014-10-18 DIAGNOSIS — R51 Headache: Secondary | ICD-10-CM

## 2014-10-18 DIAGNOSIS — Q359 Cleft palate, unspecified: Secondary | ICD-10-CM

## 2014-10-18 DIAGNOSIS — G4719 Other hypersomnia: Secondary | ICD-10-CM | POA: Diagnosis not present

## 2014-10-18 DIAGNOSIS — R519 Headache, unspecified: Secondary | ICD-10-CM

## 2014-10-18 NOTE — Patient Instructions (Signed)
We will do another sleep study with potential CPAP during the later part of the night.
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
## 2014-10-26 DIAGNOSIS — J0100 Acute maxillary sinusitis, unspecified: Secondary | ICD-10-CM | POA: Diagnosis not present

## 2014-10-31 ENCOUNTER — Ambulatory Visit (INDEPENDENT_AMBULATORY_CARE_PROVIDER_SITE_OTHER): Payer: Medicare Other | Admitting: Neurology

## 2014-10-31 DIAGNOSIS — G479 Sleep disorder, unspecified: Secondary | ICD-10-CM

## 2014-10-31 DIAGNOSIS — R0683 Snoring: Secondary | ICD-10-CM

## 2014-10-31 DIAGNOSIS — G4733 Obstructive sleep apnea (adult) (pediatric): Secondary | ICD-10-CM | POA: Diagnosis not present

## 2014-10-31 DIAGNOSIS — G4761 Periodic limb movement disorder: Secondary | ICD-10-CM

## 2014-11-01 NOTE — Sleep Study (Signed)
Please see the scanned sleep study interpretation located in the Procedure tab within the Chart Review section. 

## 2014-11-07 ENCOUNTER — Telehealth: Payer: Self-pay | Admitting: Neurology

## 2014-11-07 NOTE — Telephone Encounter (Signed)
Please call and notify the patient that the recent sleep study did not show any significant obstructive sleep apnea. He had some limb movements in sleep. Please inform patient that I would like to go over the details of the study during a follow up appointment and if not already previously scheduled, arrange a followup appointment (please utilize a followu-up slot). Also, route or fax report to PCP and referring MD, if other than PCP.  Once you have spoken to patient, you can close this encounter.   Thanks,  [name], MD, PhD [facility])

## 2014-11-08 ENCOUNTER — Encounter: Payer: Self-pay | Admitting: Neurology

## 2014-11-13 NOTE — Telephone Encounter (Signed)
Patient was contacted and instructed that Dr. Rexene Alberts had requested a follow up appointment to go over the results of his sleep study.  Patient was in agreement and a follow up appointment was scheduled for 11/19/14 at 3:30 pm.  Dr. Leanna Battles was faxed a copy of the sleep study report.

## 2014-11-16 DIAGNOSIS — J0100 Acute maxillary sinusitis, unspecified: Secondary | ICD-10-CM | POA: Diagnosis not present

## 2014-11-16 DIAGNOSIS — N3000 Acute cystitis without hematuria: Secondary | ICD-10-CM | POA: Diagnosis not present

## 2014-11-16 DIAGNOSIS — R300 Dysuria: Secondary | ICD-10-CM | POA: Diagnosis not present

## 2014-11-16 DIAGNOSIS — H9209 Otalgia, unspecified ear: Secondary | ICD-10-CM | POA: Diagnosis not present

## 2014-11-19 ENCOUNTER — Ambulatory Visit (INDEPENDENT_AMBULATORY_CARE_PROVIDER_SITE_OTHER): Payer: Medicare Other | Admitting: Neurology

## 2014-11-19 ENCOUNTER — Encounter: Payer: Self-pay | Admitting: Neurology

## 2014-11-19 VITALS — BP 118/72 | HR 68 | Temp 97.0°F | Resp 18 | Ht 70.0 in | Wt 173.0 lb

## 2014-11-19 DIAGNOSIS — G2581 Restless legs syndrome: Secondary | ICD-10-CM

## 2014-11-19 DIAGNOSIS — G4761 Periodic limb movement disorder: Secondary | ICD-10-CM | POA: Diagnosis not present

## 2014-11-19 DIAGNOSIS — Q359 Cleft palate, unspecified: Secondary | ICD-10-CM

## 2014-11-19 NOTE — Patient Instructions (Signed)
You do not have any significant obstructive sleep apnea at this time.   You have mild restless legs syndrome.    You can continue using melatonin for sleep.
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
## 2014-11-28 DIAGNOSIS — R634 Abnormal weight loss: Secondary | ICD-10-CM | POA: Diagnosis not present

## 2014-11-28 DIAGNOSIS — K921 Melena: Secondary | ICD-10-CM | POA: Diagnosis not present

## 2014-11-28 DIAGNOSIS — Z6824 Body mass index (BMI) 24.0-24.9, adult: Secondary | ICD-10-CM | POA: Diagnosis not present

## 2014-11-28 DIAGNOSIS — E291 Testicular hypofunction: Secondary | ICD-10-CM | POA: Diagnosis not present

## 2014-11-28 DIAGNOSIS — G4700 Insomnia, unspecified: Secondary | ICD-10-CM | POA: Diagnosis not present

## 2014-11-28 DIAGNOSIS — I10 Essential (primary) hypertension: Secondary | ICD-10-CM | POA: Diagnosis not present

## 2014-12-03 DIAGNOSIS — K921 Melena: Secondary | ICD-10-CM | POA: Diagnosis not present

## 2014-12-03 DIAGNOSIS — Z1212 Encounter for screening for malignant neoplasm of rectum: Secondary | ICD-10-CM | POA: Diagnosis not present

## 2014-12-27 DIAGNOSIS — H35371 Puckering of macula, right eye: Secondary | ICD-10-CM | POA: Diagnosis not present

## 2015-01-01 DIAGNOSIS — E291 Testicular hypofunction: Secondary | ICD-10-CM | POA: Diagnosis not present

## 2015-01-23 DIAGNOSIS — E291 Testicular hypofunction: Secondary | ICD-10-CM | POA: Diagnosis not present

## 2015-02-10 DIAGNOSIS — R062 Wheezing: Secondary | ICD-10-CM | POA: Diagnosis not present

## 2015-02-10 DIAGNOSIS — J209 Acute bronchitis, unspecified: Secondary | ICD-10-CM | POA: Diagnosis not present

## 2015-02-10 DIAGNOSIS — J019 Acute sinusitis, unspecified: Secondary | ICD-10-CM | POA: Diagnosis not present

## 2015-02-12 DIAGNOSIS — E291 Testicular hypofunction: Secondary | ICD-10-CM | POA: Diagnosis not present

## 2015-03-06 DIAGNOSIS — E291 Testicular hypofunction: Secondary | ICD-10-CM | POA: Diagnosis not present

## 2015-03-15 DIAGNOSIS — R634 Abnormal weight loss: Secondary | ICD-10-CM | POA: Diagnosis not present

## 2015-03-15 DIAGNOSIS — R413 Other amnesia: Secondary | ICD-10-CM | POA: Diagnosis not present

## 2015-03-15 DIAGNOSIS — I10 Essential (primary) hypertension: Secondary | ICD-10-CM | POA: Diagnosis not present

## 2015-03-15 DIAGNOSIS — R21 Rash and other nonspecific skin eruption: Secondary | ICD-10-CM | POA: Diagnosis not present

## 2015-03-15 DIAGNOSIS — Z6824 Body mass index (BMI) 24.0-24.9, adult: Secondary | ICD-10-CM | POA: Diagnosis not present

## 2015-03-15 DIAGNOSIS — E291 Testicular hypofunction: Secondary | ICD-10-CM | POA: Diagnosis not present

## 2015-03-21 ENCOUNTER — Other Ambulatory Visit: Payer: Self-pay | Admitting: Internal Medicine

## 2015-03-21 DIAGNOSIS — R634 Abnormal weight loss: Secondary | ICD-10-CM

## 2015-03-25 ENCOUNTER — Ambulatory Visit
Admission: RE | Admit: 2015-03-25 | Discharge: 2015-03-25 | Disposition: A | Discharge status: Home / Self Care | Payer: Medicare Other | Source: Ambulatory Visit | Attending: Internal Medicine | Admitting: Internal Medicine

## 2015-03-25 DIAGNOSIS — K4090 Unilateral inguinal hernia, without obstruction or gangrene, not specified as recurrent: Secondary | ICD-10-CM | POA: Diagnosis not present

## 2015-03-25 DIAGNOSIS — N400 Enlarged prostate without lower urinary tract symptoms: Secondary | ICD-10-CM | POA: Diagnosis not present

## 2015-03-25 DIAGNOSIS — Z85820 Personal history of malignant melanoma of skin: Secondary | ICD-10-CM | POA: Diagnosis not present

## 2015-03-25 DIAGNOSIS — R634 Abnormal weight loss: Secondary | ICD-10-CM

## 2015-03-25 DIAGNOSIS — N200 Calculus of kidney: Secondary | ICD-10-CM | POA: Diagnosis not present

## 2015-03-25 MED ORDER — IOPAMIDOL (ISOVUE-300) INJECTION 61%
100.0000 mL | Freq: Once | INTRAVENOUS | Status: AC | PRN
  Administered 2015-03-25: 100 mL via INTRAVENOUS

## 2015-03-27 DIAGNOSIS — D485 Neoplasm of uncertain behavior of skin: Secondary | ICD-10-CM | POA: Diagnosis not present

## 2015-03-27 DIAGNOSIS — L573 Poikiloderma of Civatte: Secondary | ICD-10-CM | POA: Diagnosis not present

## 2015-03-27 DIAGNOSIS — D225 Melanocytic nevi of trunk: Secondary | ICD-10-CM | POA: Diagnosis not present

## 2015-03-27 DIAGNOSIS — Z1283 Encounter for screening for malignant neoplasm of skin: Secondary | ICD-10-CM | POA: Diagnosis not present

## 2015-03-29 ENCOUNTER — Encounter: Payer: Self-pay | Admitting: Internal Medicine

## 2015-04-09 DIAGNOSIS — L98499 Non-pressure chronic ulcer of skin of other sites with unspecified severity: Secondary | ICD-10-CM | POA: Diagnosis not present

## 2015-04-09 DIAGNOSIS — D485 Neoplasm of uncertain behavior of skin: Secondary | ICD-10-CM | POA: Diagnosis not present

## 2015-06-03 DIAGNOSIS — Z125 Encounter for screening for malignant neoplasm of prostate: Secondary | ICD-10-CM | POA: Diagnosis not present

## 2015-06-03 DIAGNOSIS — E782 Mixed hyperlipidemia: Secondary | ICD-10-CM | POA: Diagnosis not present

## 2015-06-03 DIAGNOSIS — R8290 Unspecified abnormal findings in urine: Secondary | ICD-10-CM | POA: Diagnosis not present

## 2015-06-03 DIAGNOSIS — I10 Essential (primary) hypertension: Secondary | ICD-10-CM | POA: Diagnosis not present

## 2015-06-06 DIAGNOSIS — Z23 Encounter for immunization: Secondary | ICD-10-CM | POA: Diagnosis not present

## 2015-06-10 DIAGNOSIS — N200 Calculus of kidney: Secondary | ICD-10-CM | POA: Diagnosis not present

## 2015-06-10 DIAGNOSIS — R413 Other amnesia: Secondary | ICD-10-CM | POA: Diagnosis not present

## 2015-06-10 DIAGNOSIS — Z1389 Encounter for screening for other disorder: Secondary | ICD-10-CM | POA: Diagnosis not present

## 2015-06-10 DIAGNOSIS — E291 Testicular hypofunction: Secondary | ICD-10-CM | POA: Diagnosis not present

## 2015-06-10 DIAGNOSIS — I10 Essential (primary) hypertension: Secondary | ICD-10-CM | POA: Diagnosis not present

## 2015-06-10 DIAGNOSIS — J45909 Unspecified asthma, uncomplicated: Secondary | ICD-10-CM | POA: Diagnosis not present

## 2015-06-10 DIAGNOSIS — Z0000 Encounter for general adult medical examination without abnormal findings: Secondary | ICD-10-CM | POA: Diagnosis not present

## 2015-06-10 DIAGNOSIS — Z6824 Body mass index (BMI) 24.0-24.9, adult: Secondary | ICD-10-CM | POA: Diagnosis not present

## 2015-06-10 DIAGNOSIS — F329 Major depressive disorder, single episode, unspecified: Secondary | ICD-10-CM | POA: Diagnosis not present

## 2015-06-10 DIAGNOSIS — E782 Mixed hyperlipidemia: Secondary | ICD-10-CM | POA: Diagnosis not present

## 2015-06-10 DIAGNOSIS — R918 Other nonspecific abnormal finding of lung field: Secondary | ICD-10-CM | POA: Diagnosis not present

## 2015-06-12 DIAGNOSIS — E291 Testicular hypofunction: Secondary | ICD-10-CM | POA: Diagnosis not present

## 2015-06-12 DIAGNOSIS — Z1212 Encounter for screening for malignant neoplasm of rectum: Secondary | ICD-10-CM | POA: Diagnosis not present

## 2015-06-19 ENCOUNTER — Other Ambulatory Visit: Payer: Self-pay | Admitting: Internal Medicine

## 2015-06-19 DIAGNOSIS — R911 Solitary pulmonary nodule: Secondary | ICD-10-CM

## 2015-06-25 ENCOUNTER — Ambulatory Visit
Admission: RE | Admit: 2015-06-25 | Discharge: 2015-06-25 | Disposition: A | Discharge status: Home / Self Care | Payer: Medicare Other | Source: Ambulatory Visit | Attending: Internal Medicine | Admitting: Internal Medicine

## 2015-06-25 DIAGNOSIS — R911 Solitary pulmonary nodule: Secondary | ICD-10-CM | POA: Diagnosis not present

## 2015-06-25 DIAGNOSIS — I2510 Atherosclerotic heart disease of native coronary artery without angina pectoris: Secondary | ICD-10-CM | POA: Diagnosis not present

## 2015-06-26 DIAGNOSIS — E291 Testicular hypofunction: Secondary | ICD-10-CM | POA: Diagnosis not present

## 2015-07-04 DIAGNOSIS — J0100 Acute maxillary sinusitis, unspecified: Secondary | ICD-10-CM | POA: Diagnosis not present

## 2015-07-24 DIAGNOSIS — E291 Testicular hypofunction: Secondary | ICD-10-CM | POA: Diagnosis not present

## 2015-08-01 ENCOUNTER — Ambulatory Visit (INDEPENDENT_AMBULATORY_CARE_PROVIDER_SITE_OTHER): Payer: Medicare Other | Admitting: Cardiovascular Disease

## 2015-08-01 ENCOUNTER — Encounter: Payer: Self-pay | Admitting: Cardiovascular Disease

## 2015-08-01 VITALS — BP 134/78 | HR 62 | Ht 70.0 in | Wt 170.0 lb

## 2015-08-01 DIAGNOSIS — I2510 Atherosclerotic heart disease of native coronary artery without angina pectoris: Secondary | ICD-10-CM | POA: Diagnosis not present

## 2015-08-01 DIAGNOSIS — E785 Hyperlipidemia, unspecified: Secondary | ICD-10-CM

## 2015-08-01 MED ORDER — ATORVASTATIN CALCIUM 10 MG PO TABS: 10.0000 mg | ORAL_TABLET | Freq: Every day | ORAL | Status: DC

## 2015-08-01 NOTE — Patient Instructions (Signed)
Medication Instructions:  START Atorvastatin 10 mg once daily   Labwork: Your physician recommends that you return for lab work in: 3 months for cholesterol, basic metabolic panel, liver panel You will need to FAST for this appointment - nothing to eat or drink after midnight the night before except water.   Testing/Procedures: Your physician has requested that you have a lexiscan myoview. For further information please visit [email or web address]. Please follow instruction sheet, as given.   Follow-Up: You have been referred to Lipid clinic for management of your high cholesterol   Your physician wants you to follow-up in: 1 year with Dr. Acie Fredrickson.  You will receive a reminder letter in the mail two months in advance. If you don't receive a letter, please call our office to schedule the follow-up appointment.   If you need a refill on your cardiac medications before your next appointment, please call your pharmacy.   Thank you for choosing [facility]! Christen Bame, RN [phone]
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
## 2015-08-02 ENCOUNTER — Ambulatory Visit: Payer: Medicare Other | Admitting: Pharmacist

## 2015-08-05 ENCOUNTER — Telehealth (HOSPITAL_COMMUNITY): Payer: Self-pay | Admitting: *Deleted

## 2015-08-05 NOTE — Telephone Encounter (Signed)
Patient given detailed instructions per Myocardial Perfusion Study Information Sheet for the test on 08/12/15 at 0730. Patient notified to arrive 15 minutes early and that it is imperative to arrive on time for appointment to keep from having the test rescheduled.  If you need to cancel or reschedule your appointment, please call the office within 24 hours of your appointment. Failure to do so may result in a cancellation of your appointment, and a $50 no show fee. Patient verbalized understanding.Katera Rybka, Ranae Palms

## 2015-08-12 ENCOUNTER — Ambulatory Visit (HOSPITAL_COMMUNITY): Payer: Medicare Other | Attending: Internal Medicine

## 2015-08-12 ENCOUNTER — Telehealth: Payer: Self-pay | Admitting: Nurse Practitioner

## 2015-08-12 DIAGNOSIS — I779 Disorder of arteries and arterioles, unspecified: Secondary | ICD-10-CM | POA: Diagnosis not present

## 2015-08-12 DIAGNOSIS — I10 Essential (primary) hypertension: Secondary | ICD-10-CM | POA: Diagnosis not present

## 2015-08-12 DIAGNOSIS — I2510 Atherosclerotic heart disease of native coronary artery without angina pectoris: Secondary | ICD-10-CM | POA: Diagnosis not present

## 2015-08-12 DIAGNOSIS — R9439 Abnormal result of other cardiovascular function study: Secondary | ICD-10-CM | POA: Diagnosis not present

## 2015-08-12 LAB — MYOCARDIAL PERFUSION IMAGING
CHL CUP RESTING HR STRESS: 55 {beats}/min
CSEPPHR: 76 {beats}/min
LVDIAVOL: 85 mL
LVSYSVOL: 39 mL
NUC STRESS TID: 1.12
RATE: 0.25
SDS: 2
SRS: 8
SSS: 10

## 2015-08-12 MED ORDER — TECHNETIUM TC 99M SESTAMIBI GENERIC - CARDIOLITE
10.2000 | Freq: Once | INTRAVENOUS | Status: AC | PRN
  Administered 2015-08-12: 10 via INTRAVENOUS

## 2015-08-12 MED ORDER — TECHNETIUM TC 99M SESTAMIBI GENERIC - CARDIOLITE
31.4000 | Freq: Once | INTRAVENOUS | Status: AC | PRN
  Administered 2015-08-12: 31 via INTRAVENOUS

## 2015-08-12 MED ORDER — REGADENOSON 0.4 MG/5ML IV SOLN
0.4000 mg | Freq: Once | INTRAVENOUS | Status: AC
  Administered 2015-08-12: 0.4 mg via INTRAVENOUS

## 2015-08-12 NOTE — Telephone Encounter (Signed)
Reviewed myoview results with patient who verbalized understanding.  He advised that there are medications on his AVS that he is no longer taking.  I removed those medications from his list and advised him to continue plan for follow-up with lipid clinic in Feb. And with Dr. Acie Fredrickson in 1 year.  He verbalized understanding and agreement.

## 2015-08-12 NOTE — Telephone Encounter (Signed)
-----   Message from Thayer Headings, MD sent at 08/12/2015  5:47 PM EST ----- Low risk study

## 2015-08-14 DIAGNOSIS — E291 Testicular hypofunction: Secondary | ICD-10-CM | POA: Diagnosis not present

## 2015-08-28 DIAGNOSIS — B079 Viral wart, unspecified: Secondary | ICD-10-CM | POA: Diagnosis not present

## 2015-08-28 DIAGNOSIS — D485 Neoplasm of uncertain behavior of skin: Secondary | ICD-10-CM | POA: Diagnosis not present

## 2015-09-02 DIAGNOSIS — K5900 Constipation, unspecified: Secondary | ICD-10-CM | POA: Diagnosis not present

## 2015-09-02 DIAGNOSIS — G4733 Obstructive sleep apnea (adult) (pediatric): Secondary | ICD-10-CM | POA: Diagnosis not present

## 2015-09-02 DIAGNOSIS — I10 Essential (primary) hypertension: Secondary | ICD-10-CM | POA: Diagnosis not present

## 2015-09-02 DIAGNOSIS — H9201 Otalgia, right ear: Secondary | ICD-10-CM | POA: Diagnosis not present

## 2015-09-02 DIAGNOSIS — Z6824 Body mass index (BMI) 24.0-24.9, adult: Secondary | ICD-10-CM | POA: Diagnosis not present

## 2015-09-03 DIAGNOSIS — E291 Testicular hypofunction: Secondary | ICD-10-CM | POA: Diagnosis not present

## 2015-09-04 DIAGNOSIS — Z6824 Body mass index (BMI) 24.0-24.9, adult: Secondary | ICD-10-CM | POA: Diagnosis not present

## 2015-09-04 DIAGNOSIS — K5909 Other constipation: Secondary | ICD-10-CM | POA: Diagnosis not present

## 2015-09-04 DIAGNOSIS — I2510 Atherosclerotic heart disease of native coronary artery without angina pectoris: Secondary | ICD-10-CM | POA: Diagnosis not present

## 2015-09-04 DIAGNOSIS — I10 Essential (primary) hypertension: Secondary | ICD-10-CM | POA: Diagnosis not present

## 2015-09-24 DIAGNOSIS — E291 Testicular hypofunction: Secondary | ICD-10-CM | POA: Diagnosis not present

## 2015-11-01 ENCOUNTER — Telehealth: Payer: Self-pay | Admitting: Internal Medicine

## 2015-11-01 ENCOUNTER — Other Ambulatory Visit (INDEPENDENT_AMBULATORY_CARE_PROVIDER_SITE_OTHER): Payer: Medicare Other | Admitting: *Deleted

## 2015-11-01 DIAGNOSIS — E785 Hyperlipidemia, unspecified: Secondary | ICD-10-CM

## 2015-11-01 LAB — BASIC METABOLIC PANEL
BUN: 12 mg/dL (ref 7–25)
CHLORIDE: 99 mmol/L (ref 98–110)
CO2: 27 mmol/L (ref 20–31)
Calcium: 9.1 mg/dL (ref 8.6–10.3)
Creat: 1.09 mg/dL (ref 0.70–1.18)
GLUCOSE: 97 mg/dL (ref 65–99)
POTASSIUM: 4.3 mmol/L (ref 3.5–5.3)
SODIUM: 136 mmol/L (ref 135–146)

## 2015-11-01 LAB — LIPID PANEL
CHOL/HDL RATIO: 3.4 ratio (ref <=5.0)
CHOLESTEROL: 141 mg/dL (ref 125–200)
HDL: 41 mg/dL (ref >=40)
LDL Cholesterol: 66 mg/dL (ref <130)
Triglycerides: 170 mg/dL — ABNORMAL HIGH (ref <150)
VLDL: 34 mg/dL — ABNORMAL HIGH (ref <30)

## 2015-11-01 NOTE — Addendum Note (Signed)
Addended by: Eulis Foster on: 11/01/2015 08:53 AM   Modules accepted: Orders

## 2015-11-01 NOTE — Telephone Encounter (Signed)
Pt states he is having problems with constipation. States he has taken miralax and stool softeners but usually has to take a laxative to have a BM. Pt scheduled to see Amy Esterwood PA 11/06/15@2pm . Pt aware of appt.

## 2015-11-04 ENCOUNTER — Ambulatory Visit: Payer: Medicare Other | Admitting: Pharmacist

## 2015-11-06 ENCOUNTER — Ambulatory Visit (INDEPENDENT_AMBULATORY_CARE_PROVIDER_SITE_OTHER): Payer: Medicare Other | Admitting: Physician Assistant

## 2015-11-06 ENCOUNTER — Encounter: Payer: Self-pay | Admitting: Physician Assistant

## 2015-11-06 VITALS — BP 116/58 | HR 63 | Ht 70.0 in | Wt 177.0 lb

## 2015-11-06 DIAGNOSIS — K5909 Other constipation: Secondary | ICD-10-CM

## 2015-11-06 DIAGNOSIS — Z1211 Encounter for screening for malignant neoplasm of colon: Secondary | ICD-10-CM | POA: Diagnosis not present

## 2015-11-06 DIAGNOSIS — R194 Change in bowel habit: Secondary | ICD-10-CM | POA: Diagnosis not present

## 2015-11-06 DIAGNOSIS — Z86010 Personal history of colonic polyps: Secondary | ICD-10-CM | POA: Diagnosis not present

## 2015-11-06 MED ORDER — LUBIPROSTONE 8 MCG PO CAPS: 8.0000 ug | ORAL_CAPSULE | Freq: Two times a day (BID) | ORAL | Status: DC

## 2015-11-06 NOTE — Progress Notes (Signed)
Thank you for sending this case to me. I have reviewed the entire note, and the outlined plan seems appropriate.  

## 2015-11-06 NOTE — Progress Notes (Signed)
Patient ID: Tyler Reid, male   DOB: Oct 07, 1945, 70 y.o.   MRN: [id number]   Subjective:    Patient ID: Tyler Reid, male    DOB: 02/21/1946, 70 y.o.   MRN: [id number]  HPI Tyler Reid is a 70 year old white male previously known to Dr. Delfin Edis. He has history of adenomatous colon polyps, coronary artery disease, PTSD, asthma. Patient had undergone EGD and colonoscopy in October 2014. EGD rate revealed mild duodenitis, biopsies from the duodenum were benign Colonoscopy with finding of 2 sessile polyps 3-7 mm in size in the sigmoid colon both of which were removed and were tubular adenomas.   Patient comes in today with new onset of constipation over the past 1 year. He says he has never had problems with constipation in the past. He had been placed on verapamil and after complaining of constipation his PCP stop this and started him on atenolol and he thought the constipation improved for a little while but then worsened again. He started requiring MiraLAX which he says he was taking a couple of times a day regularly without any benefit. Had recent worsening of the constipation which required a purge with mag citrate and is now using stool softeners regularly. Over the last couple of days he has had fairly normal bowel movements. He says he is also had a lot of bloating recently postprandially no abdominal pain no melena or hematochezia. He did do Hemoccult cards through his PCP in the fall which were negative. He had been given a trial of Linzess but this seemed to cause diarrhea and he couldn't tolerate it.  Review of Systems Pertinent positive and negative review of systems were noted in the above HPI section.  All other review of systems was otherwise negative.  Outpatient Encounter Prescriptions as of 11/06/2015  Medication Sig  . albuterol (PROVENTIL HFA;VENTOLIN HFA) 108 (90 BASE) MCG/ACT inhaler Inhale 2 puffs into the lungs 4 (four) times daily. As needed  . atenolol (TENORMIN) 50 MG tablet  Take 50 mg by mouth daily.  Marland Kitchen atorvastatin (LIPITOR) 10 MG tablet Take 1 tablet (10 mg total) by mouth daily.  Marland Kitchen ipratropium (ATROVENT) 0.06 % nasal spray Place 2 sprays into both nostrils 3 (three) times daily.  Marland Kitchen loratadine (CLARITIN) 10 MG tablet Take 10 mg by mouth daily. One tablet once daily  . ranitidine (ZANTAC) 150 MG tablet Take 150 mg by mouth daily.  . [DISCONTINUED] Emollient (AQUA GLYCOLIC EX) Apply 1 application topically 2 (two) times daily.  Marland Kitchen lubiprostone (AMITIZA) 8 MCG capsule Take 1 capsule (8 mcg total) by mouth 2 (two) times daily with a meal.  . [DISCONTINUED] b complex vitamins tablet Take 1 tablet by mouth daily.  . [DISCONTINUED] Magnesium 200 MG TABS Take 1 tablet by mouth 2 (two) times daily.  . [DISCONTINUED] testosterone cypionate (DEPOTESTOTERONE CYPIONATE) 100 MG/ML injection Inject 100 mg into the muscle every 21 ( twenty-one) days. For IM use only  . [DISCONTINUED] verapamil (CALAN-SR) 120 MG CR tablet Take 120 mg by mouth 2 (two) times daily.    No facility-administered encounter medications on file as of 11/06/2015.   Allergies  Allergen Reactions  . Niacin And Related Other (See Comments)    Itching,body redness  . Statins Other (See Comments)    Muscle cramps   Patient Active Problem List   Diagnosis Date Noted  . Coronary artery calcification seen on CAT scan 08/01/2015  . UNSPECIFIED IRON DEFICIENCY ANEMIA 05/16/2008  . Hyperlipidemia 05/15/2008  . POSTTRAUMATIC  STRESS DISORDER 05/15/2008  . HEMORRHOIDS 05/15/2008  . DUODENITIS, WITHOUT HEMORRHAGE 05/15/2008  . HEMOCCULT POSITIVE STOOL 05/15/2008  . SLEEP DISORDER 05/15/2008  . COLONIC POLYPS, ADENOMATOUS, HX OF 05/15/2008  . NEPHROLITHIASIS, HX OF 05/15/2008   Social History   Social History  . Marital Status: Married    Spouse Name: N/A  . Number of Children: 1  . Years of Education: 12   Occupational History  .      retired   Social History Main Topics  . Smoking status: Never  Smoker   . Smokeless tobacco: Never Used  . Alcohol Use: No     Comment: Rare  . Drug Use: No  . Sexual Activity: Not on file   Other Topics Concern  . Not on file   Social History Narrative    Mr. Langan's family history includes Breast cancer in his mother; Melanoma in his father.      Objective:    Filed Vitals:   11/06/15 1355  BP: 116/58  Pulse: 63    Physical Exam  well-developed elderly white male in no acute distress, pleasant blood pressure 116/58 pulse 63 height 5 foot 10 weight 177. HEENT; nontraumatic normocephalic EOMI PERRLA sclera anicteric, Cardiovascular; regular rate and rhythm with S1-S2 no murmur or gallop, Pulmonary; clear bilaterally, Abdomen; soft nontender nondistended bowel sounds are active no palpable mass or hepatosplenomegaly, Rectal ;exam not done, Extremities ;no clubbing ,cyanosis, or edema skin warm and dry, Neuropsych; mood and affect appropriate     Assessment & Plan:   #1 70 yo WM with hx of adenomatous colon polyps with c/o new onset constipation over the past year - likely combination of functional and meds -r/o occult lesion #2 hx duodenitis #3 PTSD #4 asthma #5 abdominal bloating  Plan; Start trial of Amitiza 27mcg po BID Start Align one po daily Liberal fluids Schedule for Colonoscopy with Dr Loletha Carrow- procedure discussed in detail with pt and he is agreeable to proceed.   Jamere Stidham Genia Harold PA-C 11/06/2015   Cc: Leanna Battles, MD

## 2015-11-06 NOTE — Patient Instructions (Signed)
You have been scheduled for a colonoscopy. Please follow written instructions given to you at your visit today.  Please pick up your prep supplies at the pharmacy within the next 1-3 days. If you use inhalers (even only as needed), please bring them with you on the day of your procedure. Your physician has requested that you go to www.startemmi.com and enter the access code given to you at your visit today. This web site gives a general overview about your procedure. However, you should still follow specific instructions given to you by our office regarding your preparation for the procedure.  We sent a prescription for Amitiza 8 mcg to [facility] [address]  We have given you a sample of Align probiotic and a coupon. You can get this at your pharmacy. It is over the counter.

## 2015-11-14 ENCOUNTER — Ambulatory Visit (AMBULATORY_SURGERY_CENTER): Payer: Medicare Other | Admitting: Gastroenterology

## 2015-11-14 ENCOUNTER — Encounter: Payer: Self-pay | Admitting: Gastroenterology

## 2015-11-14 VITALS — BP 117/70 | HR 62 | Temp 97.3°F | Resp 24 | Ht 70.0 in | Wt 177.0 lb

## 2015-11-14 DIAGNOSIS — F4310 Post-traumatic stress disorder, unspecified: Secondary | ICD-10-CM | POA: Diagnosis not present

## 2015-11-14 DIAGNOSIS — D123 Benign neoplasm of transverse colon: Secondary | ICD-10-CM | POA: Diagnosis not present

## 2015-11-14 DIAGNOSIS — Z86010 Personal history of colonic polyps: Secondary | ICD-10-CM | POA: Diagnosis not present

## 2015-11-14 DIAGNOSIS — K635 Polyp of colon: Secondary | ICD-10-CM | POA: Diagnosis not present

## 2015-11-14 DIAGNOSIS — K5909 Other constipation: Secondary | ICD-10-CM | POA: Diagnosis not present

## 2015-11-14 DIAGNOSIS — K5900 Constipation, unspecified: Secondary | ICD-10-CM | POA: Diagnosis not present

## 2015-11-14 MED ORDER — SODIUM CHLORIDE 0.9 % IV SOLN: 500.0000 mL | INTRAVENOUS | Status: DC

## 2015-11-14 NOTE — Progress Notes (Addendum)
Patient came to admitting asleep. When he woke up he complained of abd #4. Upon more assessment the patient was in more pain than a #4 .  I did place a rectal tube without results; the patient stated that it hurt so I took it out.  No resistence met.  Patient states that it is sore inside. I informed Dr. Danis of the situation, and he said to walk the patient, and then he would come out to evaluate.  Patient walked around the unit x 2 without results; in bathroom at this time with observation.  Dr. Danis in to evaluate patient.  Told him that the patient was still #4-6.  He ordered simethicone 20mg PO for paitent's pain relief.   Patient to get dressed and go to holding room for further observation before going home.  Patient states that he feels better. Discharge instructions given by S. Hodges, RN and Nancy Campbell, LPN. Patient to call us if pain worsens. Patient and wife agreed.  Abd a but softer now. 

## 2015-11-14 NOTE — Patient Instructions (Addendum)
Plenty of water and dietary fiber and exercise to improve constipation  If constipation continues to be a problem for you after this procedure, contact us and we will see if there is any way to make the Amitiza more affordable.  Please call our office if you have not received a letter with your polyp results within 3 weeks. Ask your primary care doctor to check your thyroid function and measure a calcium level if neither have been done within the last year.  Abnormalities of these may cause constipation. Lastly, please ask your primary care physician to examine your prostate.  It seemed enlarged and somewhat firm to my exam.  While this may be normal for a man yuor age, it would be best to examine it further and determine if urology consultation is required.   YOU HAD AN ENDOSCOPIC PROCEDURE TODAY AT [facility]:   Refer to the procedure report that was given to you for any specific questions about what was found during the examination.  If the procedure report does not answer your questions, please call your gastroenterologist to clarify.  If you requested that your care partner not be given the details of your procedure findings, then the procedure report has been included in a sealed envelope for you to review at your convenience later.  YOU SHOULD EXPECT: Some feelings of bloating in the abdomen. Passage of more gas than usual.  Walking can help get rid of the air that was put into your GI tract during the procedure and reduce the bloating. If you had a lower endoscopy (such as a colonoscopy or flexible sigmoidoscopy) you may notice spotting of blood in your stool or on the toilet paper. If you underwent a bowel prep for your procedure, you may not have a normal bowel movement for a few days.  Please Note:  You might notice some irritation and congestion in your nose or some drainage.  This is from the oxygen used during your procedure.  There is no need for concern and it should  clear up in a day or so.  SYMPTOMS TO REPORT IMMEDIATELY:   Following lower endoscopy (colonoscopy or flexible sigmoidoscopy):  Excessive amounts of blood in the stool  Significant tenderness or worsening of abdominal pains  Swelling of the abdomen that is new, acute  Fever of 100F or higher   For urgent or emergent issues, a gastroenterologist can be reached at any hour by calling [phone].   DIET: Your first meal following the procedure should be a small meal and then it is ok to progress to your normal diet. Heavy or fried foods are harder to digest and may make you feel nauseous or bloated.  Likewise, meals heavy in dairy and vegetables can increase bloating.  Drink plenty of fluids but you should avoid alcoholic beverages for 24 hours. Try to increase the fiber in your diet.  ACTIVITY:  You should plan to take it easy for the rest of today and you should NOT DRIVE or use heavy machinery until tomorrow (because of the sedation medicines used during the test).    FOLLOW UP: Our staff will call the number listed on your records the next business day following your procedure to check on you and address any questions or concerns that you may have regarding the information given to you following your procedure. If we do not reach you, we will leave a message.  However, if you are feeling well and you are not  experiencing any problems, there is no need to return our call.  We will assume that you have returned to your regular daily activities without incident.  If any biopsies were taken you will be contacted by phone or by letter within the next 1-3 weeks.  Please call us at [phone] if you have not heard about the biopsies in 3 weeks.    SIGNATURES/CONFIDENTIALITY: You and/or your care partner have signed paperwork which will be entered into your electronic medical record.  These signatures attest to the fact that that the information above on your After Visit Summary has been  reviewed and is understood.  Full responsibility of the confidentiality of this discharge information lies with you and/or your care-partner.  Read all of the handouts given to you by your recovery room nurse.  Thank-you for choosing [place] for your healthcare needs.

## 2015-11-14 NOTE — Progress Notes (Signed)
Stable to RR 

## 2015-11-14 NOTE — Op Note (Signed)
[facility]  [address] [place], [id number]   COLONOSCOPY PROCEDURE REPORT  PATIENT: Tyler Reid, Tyler Reid  MR#: [id number] BIRTHDATE: 1946/02/09 , 65  yrs. old GENDER: male ENDOSCOPIST: Wilfrid Lund, MD REFERRED [id number] Sharlett Iles, M.D. PROCEDURE DATE:  11/14/2015 PROCEDURE:   Colonoscopy, diagnostic and Colonoscopy with biopsy  ASA CLASS:   Class II INDICATIONS:constipation. MEDICATIONS: Monitored anesthesia care and Propofol 200 mg IV  DESCRIPTION OF PROCEDURE:   After the risks benefits and alternatives of the procedure were thoroughly explained, informed consent was obtained.  The digital rectal exam revealed an enlarged prostate and revealed increased firmness of the prostate.   The LB [id number]  endoscope was introduced through the anus and advanced to the cecum, which was identified by both the appendix and ileocecal valve. No adverse events experienced.   The quality of the prep was excellent.  (Suprep was used)  The instrument was then slowly withdrawn as the colon was fully examined. Estimated blood loss is zero unless otherwise noted in this procedure report.      COLON FINDINGS: There was mild diverticulosis noted in the sigmoid colon.   A sessile polyp measuring 4 mm in size was found in the transverse colon.  A polypectomy was performed with cold forceps. The resection was complete, the polyp tissue was completely retrieved and sent to histology.  Retroflexed views revealed no abnormalities. The time to cecum = 3.2 Withdrawal time = 9.8   The scope was withdrawn and the procedure completed. COMPLICATIONS: There were no immediate complications.  ENDOSCOPIC IMPRESSION: 1.   There was mild diverticulosis noted in the sigmoid colon 2.   Sessile polyp was found in the transverse colon; polypectomy was performed with cold forceps The prostate was also found to be enlarged and firm during rectal exam. RECOMMENDATIONS: Increased water intake,  dietary fiber, and physical exercise to improve constipation.  If not improved with these measures, contact our office and we can see if there is a way to make the Amitiza more affordable.  Please see your primary care physician regarding the enlarged and somewhat firm prostate found on exam today.  Repeat Colonoscopy interval dependent upon polyp results.  eSigned:  Wilfrid Lund, MD 11/14/2015 10:41 AM   cc:   PATIENT NAME:  [name], [name] MR#: [id number]

## 2015-11-14 NOTE — Progress Notes (Signed)
Called to room to assist during endoscopic procedure.  Patient ID and intended procedure confirmed with present staff. Received instructions for my participation in the procedure from the performing physician.  

## 2015-11-15 ENCOUNTER — Telehealth: Payer: Self-pay

## 2015-11-15 NOTE — Telephone Encounter (Signed)
  Follow up Call-  Call back number 11/14/2015 06/29/2013  Post procedure Call Back phone  # [phone]  Permission to leave phone message Yes Yes     Patient was called for follow up after procedure on 11/14/2015. I spoke with the patients wife and she reports that Eschol has returned to his normal daily activities and was doing fine.

## 2015-11-20 ENCOUNTER — Encounter: Payer: Self-pay | Admitting: Gastroenterology

## 2015-12-23 DIAGNOSIS — R3911 Hesitancy of micturition: Secondary | ICD-10-CM | POA: Diagnosis not present

## 2015-12-23 DIAGNOSIS — Z6824 Body mass index (BMI) 24.0-24.9, adult: Secondary | ICD-10-CM | POA: Diagnosis not present

## 2015-12-23 DIAGNOSIS — I10 Essential (primary) hypertension: Secondary | ICD-10-CM | POA: Diagnosis not present

## 2015-12-23 DIAGNOSIS — K5909 Other constipation: Secondary | ICD-10-CM | POA: Diagnosis not present

## 2016-02-05 DIAGNOSIS — K4090 Unilateral inguinal hernia, without obstruction or gangrene, not specified as recurrent: Secondary | ICD-10-CM | POA: Diagnosis not present

## 2016-02-05 DIAGNOSIS — N400 Enlarged prostate without lower urinary tract symptoms: Secondary | ICD-10-CM | POA: Diagnosis not present

## 2016-02-05 DIAGNOSIS — N281 Cyst of kidney, acquired: Secondary | ICD-10-CM | POA: Diagnosis not present

## 2016-02-05 DIAGNOSIS — R3129 Other microscopic hematuria: Secondary | ICD-10-CM | POA: Diagnosis not present

## 2016-02-05 DIAGNOSIS — Z0000 Encounter for general adult medical examination without abnormal findings: Secondary | ICD-10-CM | POA: Diagnosis not present

## 2016-02-05 DIAGNOSIS — N200 Calculus of kidney: Secondary | ICD-10-CM | POA: Diagnosis not present

## 2016-02-18 DIAGNOSIS — J0100 Acute maxillary sinusitis, unspecified: Secondary | ICD-10-CM | POA: Diagnosis not present

## 2016-02-19 DIAGNOSIS — D485 Neoplasm of uncertain behavior of skin: Secondary | ICD-10-CM | POA: Diagnosis not present

## 2016-02-19 DIAGNOSIS — D225 Melanocytic nevi of trunk: Secondary | ICD-10-CM | POA: Diagnosis not present

## 2016-02-19 DIAGNOSIS — L821 Other seborrheic keratosis: Secondary | ICD-10-CM | POA: Diagnosis not present

## 2016-03-11 DIAGNOSIS — J0100 Acute maxillary sinusitis, unspecified: Secondary | ICD-10-CM | POA: Diagnosis not present

## 2016-03-11 DIAGNOSIS — N3001 Acute cystitis with hematuria: Secondary | ICD-10-CM | POA: Diagnosis not present

## 2016-06-08 ENCOUNTER — Encounter: Payer: Self-pay | Admitting: Cardiovascular Disease

## 2016-06-08 DIAGNOSIS — Z125 Encounter for screening for malignant neoplasm of prostate: Secondary | ICD-10-CM | POA: Diagnosis not present

## 2016-06-08 DIAGNOSIS — R8299 Other abnormal findings in urine: Secondary | ICD-10-CM | POA: Diagnosis not present

## 2016-06-08 DIAGNOSIS — E782 Mixed hyperlipidemia: Secondary | ICD-10-CM | POA: Diagnosis not present

## 2016-06-08 DIAGNOSIS — I10 Essential (primary) hypertension: Secondary | ICD-10-CM | POA: Diagnosis not present

## 2016-06-15 DIAGNOSIS — R413 Other amnesia: Secondary | ICD-10-CM | POA: Diagnosis not present

## 2016-06-15 DIAGNOSIS — I10 Essential (primary) hypertension: Secondary | ICD-10-CM | POA: Diagnosis not present

## 2016-06-15 DIAGNOSIS — E298 Other testicular dysfunction: Secondary | ICD-10-CM | POA: Diagnosis not present

## 2016-06-15 DIAGNOSIS — Z1212 Encounter for screening for malignant neoplasm of rectum: Secondary | ICD-10-CM | POA: Diagnosis not present

## 2016-06-15 DIAGNOSIS — Z23 Encounter for immunization: Secondary | ICD-10-CM | POA: Diagnosis not present

## 2016-06-15 DIAGNOSIS — R918 Other nonspecific abnormal finding of lung field: Secondary | ICD-10-CM | POA: Diagnosis not present

## 2016-06-15 DIAGNOSIS — G4709 Other insomnia: Secondary | ICD-10-CM | POA: Diagnosis not present

## 2016-06-15 DIAGNOSIS — E782 Mixed hyperlipidemia: Secondary | ICD-10-CM | POA: Diagnosis not present

## 2016-06-15 DIAGNOSIS — Z1389 Encounter for screening for other disorder: Secondary | ICD-10-CM | POA: Diagnosis not present

## 2016-06-15 DIAGNOSIS — N200 Calculus of kidney: Secondary | ICD-10-CM | POA: Diagnosis not present

## 2016-06-15 DIAGNOSIS — Z6825 Body mass index (BMI) 25.0-25.9, adult: Secondary | ICD-10-CM | POA: Diagnosis not present

## 2016-06-15 DIAGNOSIS — I2510 Atherosclerotic heart disease of native coronary artery without angina pectoris: Secondary | ICD-10-CM | POA: Diagnosis not present

## 2016-06-15 DIAGNOSIS — Z0000 Encounter for general adult medical examination without abnormal findings: Secondary | ICD-10-CM | POA: Diagnosis not present

## 2016-06-17 ENCOUNTER — Other Ambulatory Visit: Payer: Self-pay | Admitting: Internal Medicine

## 2016-06-17 DIAGNOSIS — R918 Other nonspecific abnormal finding of lung field: Secondary | ICD-10-CM

## 2016-06-23 DIAGNOSIS — D485 Neoplasm of uncertain behavior of skin: Secondary | ICD-10-CM | POA: Diagnosis not present

## 2016-06-25 ENCOUNTER — Ambulatory Visit
Admission: RE | Admit: 2016-06-25 | Discharge: 2016-06-25 | Disposition: A | Discharge status: Home / Self Care | Payer: Medicare Other | Source: Ambulatory Visit | Attending: Internal Medicine | Admitting: Internal Medicine

## 2016-06-25 DIAGNOSIS — R918 Other nonspecific abnormal finding of lung field: Secondary | ICD-10-CM | POA: Diagnosis not present

## 2016-07-22 DIAGNOSIS — J4520 Mild intermittent asthma, uncomplicated: Secondary | ICD-10-CM | POA: Diagnosis not present

## 2016-07-22 DIAGNOSIS — J3089 Other allergic rhinitis: Secondary | ICD-10-CM | POA: Diagnosis not present

## 2016-07-22 DIAGNOSIS — J300 Vasomotor rhinitis: Secondary | ICD-10-CM | POA: Diagnosis not present

## 2016-07-22 DIAGNOSIS — K219 Gastro-esophageal reflux disease without esophagitis: Secondary | ICD-10-CM | POA: Diagnosis not present

## 2016-07-31 ENCOUNTER — Encounter: Payer: Self-pay | Admitting: Cardiovascular Disease

## 2016-07-31 ENCOUNTER — Ambulatory Visit (INDEPENDENT_AMBULATORY_CARE_PROVIDER_SITE_OTHER): Payer: Medicare Other | Admitting: Cardiovascular Disease

## 2016-07-31 VITALS — BP 126/78 | HR 45 | Ht 70.0 in | Wt 177.8 lb

## 2016-07-31 DIAGNOSIS — E782 Mixed hyperlipidemia: Secondary | ICD-10-CM

## 2016-07-31 DIAGNOSIS — I2510 Atherosclerotic heart disease of native coronary artery without angina pectoris: Secondary | ICD-10-CM

## 2016-07-31 MED ORDER — ATENOLOL 25 MG PO TABS: 25.0000 mg | ORAL_TABLET | Freq: Every day | ORAL | 11 refills | Status: DC

## 2016-07-31 MED ORDER — ATORVASTATIN CALCIUM 20 MG PO TABS: 20.0000 mg | ORAL_TABLET | Freq: Every day | ORAL | 3 refills | Status: DC

## 2016-07-31 MED ORDER — ATENOLOL 25 MG PO TABS: 25.0000 mg | ORAL_TABLET | Freq: Two times a day (BID) | ORAL | 11 refills | Status: DC

## 2016-07-31 NOTE — Patient Instructions (Signed)
Medication Instructions:  INCREASE Atorvastatin to 20 mg once daily DECREASE Atenolol to 25 mg once daily   Labwork: Your physician recommends that you return for lab work in: 3 months You will need to FAST for this appointment - nothing to eat or drink after midnight the night before except water.   Testing/Procedures: None Ordered   Follow-Up: Your physician wants you to follow-up in: 1 year with Dr. Acie Fredrickson.  You will receive a reminder letter in the mail two months in advance. If you don't receive a letter, please call our office to schedule the follow-up appointment.   If you need a refill on your cardiac medications before your next appointment, please call your pharmacy.   Thank you for choosing [facility]! Christen Bame, RN [phone]

## 2016-07-31 NOTE — Addendum Note (Signed)
Addended by: Emmaline Life on: 07/31/2016 12:00 PM   Modules accepted: Orders
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
## 2016-08-31 DIAGNOSIS — Z961 Presence of intraocular lens: Secondary | ICD-10-CM | POA: Diagnosis not present

## 2016-08-31 DIAGNOSIS — H35371 Puckering of macula, right eye: Secondary | ICD-10-CM | POA: Diagnosis not present

## 2016-09-25 DIAGNOSIS — R05 Cough: Secondary | ICD-10-CM | POA: Diagnosis not present

## 2016-09-25 DIAGNOSIS — F329 Major depressive disorder, single episode, unspecified: Secondary | ICD-10-CM | POA: Diagnosis not present

## 2016-09-25 DIAGNOSIS — Z1389 Encounter for screening for other disorder: Secondary | ICD-10-CM | POA: Diagnosis not present

## 2016-09-25 DIAGNOSIS — Z6825 Body mass index (BMI) 25.0-25.9, adult: Secondary | ICD-10-CM | POA: Diagnosis not present

## 2016-09-25 DIAGNOSIS — I2510 Atherosclerotic heart disease of native coronary artery without angina pectoris: Secondary | ICD-10-CM | POA: Diagnosis not present

## 2016-09-25 DIAGNOSIS — R413 Other amnesia: Secondary | ICD-10-CM | POA: Diagnosis not present

## 2016-11-02 ENCOUNTER — Other Ambulatory Visit: Payer: Medicare Other | Admitting: *Deleted

## 2016-11-02 DIAGNOSIS — E7800 Pure hypercholesterolemia, unspecified: Secondary | ICD-10-CM

## 2016-11-02 LAB — COMPREHENSIVE METABOLIC PANEL
ALT: 24 IU/L (ref 0–44)
AST: 27 IU/L (ref 0–40)
Albumin/Globulin Ratio: 2 (ref 1.2–2.2)
Albumin: 4.3 g/dL (ref 3.5–4.8)
Alkaline Phosphatase: 53 IU/L (ref 39–117)
BUN/Creatinine Ratio: 10 (ref 10–24)
BUN: 11 mg/dL (ref 8–27)
Bilirubin Total: 0.7 mg/dL (ref 0.0–1.2)
CALCIUM: 8.6 mg/dL (ref 8.6–10.2)
CHLORIDE: 97 mmol/L (ref 96–106)
CO2: 25 mmol/L (ref 18–29)
Creatinine, Ser: 1.1 mg/dL (ref 0.76–1.27)
GFR, EST AFRICAN AMERICAN: 78 mL/min/{1.73_m2} (ref >59)
GFR, EST NON AFRICAN AMERICAN: 67 mL/min/{1.73_m2} (ref >59)
GLUCOSE: 88 mg/dL (ref 65–99)
Globulin, Total: 2.1 g/dL (ref 1.5–4.5)
Potassium: 4.6 mmol/L (ref 3.5–5.2)
Sodium: 139 mmol/L (ref 134–144)
TOTAL PROTEIN: 6.4 g/dL (ref 6.0–8.5)

## 2016-11-02 LAB — LIPID PANEL
Chol/HDL Ratio: 3.4 ratio units (ref 0.0–5.0)
Cholesterol, Total: 126 mg/dL (ref 100–199)
HDL: 37 mg/dL — ABNORMAL LOW (ref >39)
LDL Calculated: 65 mg/dL (ref 0–99)
Triglycerides: 120 mg/dL (ref 0–149)
VLDL CHOLESTEROL CAL: 24 mg/dL (ref 5–40)

## 2016-11-02 NOTE — Addendum Note (Signed)
Addended by: Eulis Foster on: 11/02/2016 09:46 AM   Modules accepted: Orders

## 2016-11-04 DIAGNOSIS — H35371 Puckering of macula, right eye: Secondary | ICD-10-CM | POA: Diagnosis not present

## 2016-11-04 DIAGNOSIS — H527 Unspecified disorder of refraction: Secondary | ICD-10-CM | POA: Diagnosis not present

## 2016-11-04 DIAGNOSIS — Z961 Presence of intraocular lens: Secondary | ICD-10-CM | POA: Diagnosis not present

## 2016-11-11 DIAGNOSIS — B353 Tinea pedis: Secondary | ICD-10-CM | POA: Diagnosis not present

## 2016-12-01 DIAGNOSIS — B353 Tinea pedis: Secondary | ICD-10-CM | POA: Diagnosis not present

## 2016-12-02 DIAGNOSIS — J0100 Acute maxillary sinusitis, unspecified: Secondary | ICD-10-CM | POA: Diagnosis not present

## 2017-01-05 ENCOUNTER — Other Ambulatory Visit: Payer: Self-pay | Admitting: Internal Medicine

## 2017-01-05 DIAGNOSIS — R42 Dizziness and giddiness: Secondary | ICD-10-CM | POA: Diagnosis not present

## 2017-01-05 DIAGNOSIS — Z6824 Body mass index (BMI) 24.0-24.9, adult: Secondary | ICD-10-CM | POA: Diagnosis not present

## 2017-01-05 DIAGNOSIS — I10 Essential (primary) hypertension: Secondary | ICD-10-CM | POA: Diagnosis not present

## 2017-01-05 DIAGNOSIS — R55 Syncope and collapse: Secondary | ICD-10-CM | POA: Diagnosis not present

## 2017-01-05 DIAGNOSIS — J45909 Unspecified asthma, uncomplicated: Secondary | ICD-10-CM | POA: Diagnosis not present

## 2017-01-11 ENCOUNTER — Ambulatory Visit
Admission: RE | Admit: 2017-01-11 | Discharge: 2017-01-11 | Disposition: A | Discharge status: Home / Self Care | Payer: Medicare Other | Source: Ambulatory Visit | Attending: Internal Medicine | Admitting: Internal Medicine

## 2017-01-11 DIAGNOSIS — R55 Syncope and collapse: Secondary | ICD-10-CM

## 2017-01-11 DIAGNOSIS — R42 Dizziness and giddiness: Secondary | ICD-10-CM

## 2017-01-11 DIAGNOSIS — I6523 Occlusion and stenosis of bilateral carotid arteries: Secondary | ICD-10-CM | POA: Diagnosis not present

## 2017-01-12 DIAGNOSIS — E782 Mixed hyperlipidemia: Secondary | ICD-10-CM | POA: Diagnosis not present

## 2017-01-12 DIAGNOSIS — I10 Essential (primary) hypertension: Secondary | ICD-10-CM | POA: Diagnosis not present

## 2017-01-12 DIAGNOSIS — Z6825 Body mass index (BMI) 25.0-25.9, adult: Secondary | ICD-10-CM | POA: Diagnosis not present

## 2017-01-12 DIAGNOSIS — R55 Syncope and collapse: Secondary | ICD-10-CM | POA: Diagnosis not present

## 2017-01-12 DIAGNOSIS — I2510 Atherosclerotic heart disease of native coronary artery without angina pectoris: Secondary | ICD-10-CM | POA: Diagnosis not present

## 2017-02-11 DIAGNOSIS — I10 Essential (primary) hypertension: Secondary | ICD-10-CM | POA: Diagnosis not present

## 2017-02-11 DIAGNOSIS — E782 Mixed hyperlipidemia: Secondary | ICD-10-CM | POA: Diagnosis not present

## 2017-02-11 DIAGNOSIS — Z6825 Body mass index (BMI) 25.0-25.9, adult: Secondary | ICD-10-CM | POA: Diagnosis not present

## 2017-02-11 DIAGNOSIS — G4733 Obstructive sleep apnea (adult) (pediatric): Secondary | ICD-10-CM | POA: Diagnosis not present

## 2017-02-24 DIAGNOSIS — H01009 Unspecified blepharitis unspecified eye, unspecified eyelid: Secondary | ICD-10-CM | POA: Diagnosis not present

## 2017-03-29 DIAGNOSIS — H1830 Unspecified corneal membrane change: Secondary | ICD-10-CM | POA: Diagnosis not present

## 2017-03-29 DIAGNOSIS — H16223 Keratoconjunctivitis sicca, not specified as Sjogren's, bilateral: Secondary | ICD-10-CM | POA: Diagnosis not present

## 2017-03-29 DIAGNOSIS — H01009 Unspecified blepharitis unspecified eye, unspecified eyelid: Secondary | ICD-10-CM | POA: Diagnosis not present

## 2017-05-20 ENCOUNTER — Ambulatory Visit (INDEPENDENT_AMBULATORY_CARE_PROVIDER_SITE_OTHER): Payer: Medicare Other | Admitting: Neurology

## 2017-05-20 ENCOUNTER — Encounter: Payer: Self-pay | Admitting: Neurology

## 2017-05-20 VITALS — BP 127/75 | HR 76 | Ht 70.0 in | Wt 174.0 lb

## 2017-05-20 DIAGNOSIS — R51 Headache: Secondary | ICD-10-CM | POA: Diagnosis not present

## 2017-05-20 DIAGNOSIS — Q359 Cleft palate, unspecified: Secondary | ICD-10-CM

## 2017-05-20 DIAGNOSIS — R0683 Snoring: Secondary | ICD-10-CM | POA: Diagnosis not present

## 2017-05-20 DIAGNOSIS — R030 Elevated blood-pressure reading, without diagnosis of hypertension: Secondary | ICD-10-CM | POA: Diagnosis not present

## 2017-05-20 DIAGNOSIS — R519 Headache, unspecified: Secondary | ICD-10-CM

## 2017-05-20 NOTE — Patient Instructions (Addendum)
We will recheck your sleep for signs of sleep apnea.  We will call you with the results.  We can consider an oral appliance and refer you to a dentist if needed, if you have OSA (obstructive sleep apnea).
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
## 2017-06-09 ENCOUNTER — Ambulatory Visit (INDEPENDENT_AMBULATORY_CARE_PROVIDER_SITE_OTHER): Payer: Medicare Other | Admitting: Neurology

## 2017-06-09 DIAGNOSIS — R030 Elevated blood-pressure reading, without diagnosis of hypertension: Secondary | ICD-10-CM

## 2017-06-09 DIAGNOSIS — R519 Headache, unspecified: Secondary | ICD-10-CM

## 2017-06-09 DIAGNOSIS — R0683 Snoring: Secondary | ICD-10-CM

## 2017-06-09 DIAGNOSIS — G4761 Periodic limb movement disorder: Secondary | ICD-10-CM

## 2017-06-09 DIAGNOSIS — G4733 Obstructive sleep apnea (adult) (pediatric): Secondary | ICD-10-CM | POA: Diagnosis not present

## 2017-06-09 DIAGNOSIS — R51 Headache: Secondary | ICD-10-CM

## 2017-06-09 DIAGNOSIS — G4720 Circadian rhythm sleep disorder, unspecified type: Secondary | ICD-10-CM

## 2017-06-09 DIAGNOSIS — Q359 Cleft palate, unspecified: Secondary | ICD-10-CM

## 2017-06-10 ENCOUNTER — Telehealth: Payer: Self-pay

## 2017-06-10 DIAGNOSIS — R8299 Other abnormal findings in urine: Secondary | ICD-10-CM | POA: Diagnosis not present

## 2017-06-10 DIAGNOSIS — Z125 Encounter for screening for malignant neoplasm of prostate: Secondary | ICD-10-CM | POA: Diagnosis not present

## 2017-06-10 DIAGNOSIS — I10 Essential (primary) hypertension: Secondary | ICD-10-CM | POA: Diagnosis not present

## 2017-06-10 DIAGNOSIS — E782 Mixed hyperlipidemia: Secondary | ICD-10-CM | POA: Diagnosis not present

## 2017-06-10 DIAGNOSIS — N390 Urinary tract infection, site not specified: Secondary | ICD-10-CM | POA: Diagnosis not present

## 2017-06-10 NOTE — Telephone Encounter (Signed)
I called pt. I advised pt that Dr. Rexene Alberts reviewed their sleep study results and found that pt has moderate to severe osa and Dr. Rexene Alberts recommends treatment for this in the form of a cpap. Dr. Rexene Alberts recommends that pt return for a repeat sleep study in order to properly titrate the cpap and ensure a good mask fit. I advised pt that we know we would prefer a dental device but that Dr. Rexene Alberts recommends treatment in the form of a cpap, and the first line treatment is typically a PAP for the severity of pt's sleep apnea. I explained that his other options are to avoid supine sleep and pt should pursue weight loss, upper airway or jaw surgery in some pts, oral appliance, or ENT evaluation and/or consultation with a maxillofacial surgeon or dentist may be feasible in some options.  Pt is agreeable to returning for a titration study. I advised pt that our sleep lab will file with pt's insurance and call pt to schedule the sleep study when we hear back from the pt's insurance regarding coverage of this sleep study. Pt verbalized understanding of results. Pt had no questions at this time but was encouraged to call back if questions arise.

## 2017-06-10 NOTE — Addendum Note (Signed)
Addended by: [name] on: 06/10/2017 08:19 AM   Modules accepted: Orders

## 2017-06-10 NOTE — Progress Notes (Signed)
Patient seen in FU for OSA (previously treated with OA) on 05/20/17, diagnostic PSG on 06/09/17.    Please call and notify the patient that the recent sleep study did confirm the diagnosis of moderate to severe obstructive sleep apnea, with a total AHI of 16.1/hour, REM AHI of 56.1/hour, supine AHI of 15.6/hour and O2 nadir of 79%. Treatment with positive airway pressure in the form of CPAP is recommended. I know, patient would prefer a dental device, but I would recommend, he try CPAP first. This will require a full night titration study to optimize therapy. Other treatment options, generally speaking, may include avoidance of supine sleep position along with weight loss, upper airway or jaw surgery in selected patients or the use of an oral appliance in certain patients. ENT evaluation and/or consultation with a maxillofacial surgeon or dentist may be feasible in some instances. However, for moderate to severe OSA the first line treatment is typically in the form of PAP. I have placed an order in the chart. Thanks, and please route to [facility] for scheduling next sleep study.  [name], MD, PhD [facility])

## 2017-06-10 NOTE — Telephone Encounter (Signed)
-----   Message from [name], MD sent at 06/10/2017  8:19 AM EDT ----- Patient seen in FU for OSA (previously treated with OA) on 05/20/17, diagnostic PSG on 06/09/17.    Please call and notify the patient that the recent sleep study did confirm the diagnosis of moderate to severe obstructive sleep apnea, with a total AHI of 16.1/hour, REM AHI of 56.1/hour, supine AHI of 15.6/hour and O2 nadir of 79%. Treatment with positive airway pressure in the form of CPAP is recommended. I know, patient would prefer a dental device, but I would recommend, he try CPAP first. This will require a full night titration study to optimize therapy. Other treatment options, generally speaking, may include avoidance of supine sleep position along with weight loss, upper airway or jaw surgery in selected patients or the use of an oral appliance in certain patients. ENT evaluation and/or consultation with a maxillofacial surgeon or dentist may be feasible in some instances. However, for moderate to severe OSA the first line treatment is typically in the form of PAP. I have placed an order in the chart. Thanks, and please route to [facility] for scheduling next sleep study.  [name], MD, PhD [facility])

## 2017-06-10 NOTE — Procedures (Signed)
PATIENT'S NAME:  Tyler Reid, Tyler Reid DOB:      23-Jan-1946      MR#:    [id number]     DATE OF RECORDING: 06/09/2017 REFERRING M.D.:  Leanna Battles MD Study Performed:   Baseline Polysomnogram HISTORY: 71 year old man with an underlying medical history of hypertension, cleft palate (s/p repair at age 88 and additional surgeries as a teenager), overweight state, vitamin D deficiency and allergies, who presents for re-evaluation of his OSA. He was treated with a dental device some years ago. He has noted recent increases in BP values in the mornings. He denies RLS symptoms. The patient endorsed the Epworth Sleepiness Scale at 9 points. The patient's weight 174 pounds with a height of 70 (inches), resulting in a BMI of 24.9 kg/m2. The patient's neck circumference measured 15 inches.  CURRENT MEDICATIONS: Norvasc, Astelin, Zyrtec, Vistaril, Cozaar, Metrogel, Zantac, Ubiquinol   PROCEDURE:  This is a multichannel digital polysomnogram utilizing the Somnostar 11.2 system.  Electrodes and sensors were applied and monitored per AASM Specifications.   EEG, EOG, Chin and Limb EMG, were sampled at 200 Hz.  ECG, Snore and Nasal Pressure, Thermal Airflow, Respiratory Effort, CPAP Flow and Pressure, Oximetry was sampled at 50 Hz. Digital video and audio were recorded.      BASELINE STUDY  Lights Out was at 00:04 and Lights On at 06:54.  Total recording time (TRT) was 410.5 minutes, with a total sleep time (TST) of  365.5 minutes.   The patient's sleep latency was 9 minutes.  REM latency was 83.5 minutes, which is normal. The sleep efficiency was 89. %.     SLEEP ARCHITECTURE: WASO (Wake after sleep onset) was 35.5 minutes with mild to moderate sleep fragmentation noted. There were 23 minutes in Stage N1, 117.5 minutes Stage N2, 179 minutes Stage N3 and 46 minutes in Stage REM. The percentage of Stage N1 was 6.3%, Stage N2 was 32.1%, which is reduced, Stage N3 was 49.%, which is increased, and Stage R (REM sleep) was  12.6%, which is reduced. The arousals were noted as: 25 were spontaneous, 0 were associated with PLMs, 17 were associated with respiratory events.    Audio and video analysis did not show any abnormal or unusual movements, behaviors, phonations or vocalizations. The patient took bathroom breaks.  Snoring was noted. EKG was in keeping with normal sinus rhythm (NSR).  RESPIRATORY ANALYSIS:  There were a total of 98 respiratory events:  27 obstructive apneas, 6 central apneas and 3 mixed apneas with a total of 36 apneas and an apnea index (AI) of 5.9 /hour. There were 62 hypopneas with a hypopnea index of 10.2 /hour. The patient also had 2 respiratory event related arousals (RERAs).      The total APNEA/HYPOPNEA INDEX (AHI) was 16.1/hour and the total RESPIRATORY DISTURBANCE INDEX was 16.4 /hour.  43 events occurred in REM sleep and 81 events in NREM. The REM AHI was 56.1 /hour, versus a non-REM AHI of 10.3. The patient spent 230 minutes of total sleep time in the supine position and 136 minutes in non-supine.. The supine AHI was 15.6 versus a non-supine AHI of 16.9.  OXYGEN SATURATION & C02:  The Wake baseline 02 saturation was 98%, with the lowest being 79%. Time spent below 89% saturation equaled 23 minutes.  PERIODIC LIMB MOVEMENTS: The patient had a total of 187 Periodic Limb Movements.  The Periodic Limb Movement (PLM) index was 30.7 and the PLM Arousal index was 0/hour. Post-study, the patient indicated that sleep was  better than usual.   IMPRESSION: 1. Obstructive Sleep Apnea(OSA) 2. Periodic Limb Movement Disorder (PLMD) 3. Dysfunctions associated with sleep stages or arousal from sleep  RECOMMENDATIONS: 1. This study demonstrates moderate to severe obstructive sleep apnea, with a total AHI of 16.1/hour, REM AHI of 56.1/hour, supine AHI of 15.6/hour and O2 nadir of 79. Treatment with positive airway pressure in the form of CPAP is recommended. This will require a full night titration study  to optimize therapy. Other treatment options, generally speaking, may include avoidance of supine sleep position along with weight loss, upper airway or jaw surgery in selected patients or the use of an oral appliance in certain patients. ENT evaluation and/or consultation with a maxillofacial surgeon or dentist may be feasible in some instances. However, for moderate to severe OSA the first line treatment is typically in the form of PAP. This will be discussed with the patient.  2. Please note that untreated obstructive sleep apnea carries additional perioperative morbidity. Patients with significant obstructive sleep apnea should receive perioperative PAP therapy and the surgeons and particularly the anesthesiologist should be informed of the diagnosis and the severity of the sleep disordered breathing. 3. Moderate PLMs (periodic limb movements of sleep) were noted during the study only without significant arousals; clinical correlation is recommended.  4. This study shows sleep fragmentation and abnormal sleep stage percentages; these are nonspecific findings and per se do not signify an intrinsic sleep disorder or a cause for the patient's sleep-related symptoms. Causes include (but are not limited to) the first night effect of the sleep study, circadian rhythm disturbances, medication effect or an underlying mood disorder or medical problem.  5. The patient should be cautioned not to drive, work at heights, or operate dangerous or heavy equipment when tired or sleepy. Review and reiteration of good sleep hygiene measures should be pursued with any patient. 6. The patient will be seen in follow-up by Dr. Rexene Alberts at [facility] for discussion of the test results and further management strategies. The referring provider will be notified of the test results.  I certify that I have reviewed the entire raw data recording prior to the issuance of this report in accordance with the Standards of Accreditation of the  American Academy of Sleep Medicine (AASM)  [name], MD, PhD Diplomat, American Board of Psychiatry and Neurology (Neurology and Sleep Medicine)

## 2017-06-14 DIAGNOSIS — J029 Acute pharyngitis, unspecified: Secondary | ICD-10-CM | POA: Diagnosis not present

## 2017-06-14 DIAGNOSIS — J069 Acute upper respiratory infection, unspecified: Secondary | ICD-10-CM | POA: Diagnosis not present

## 2017-06-17 DIAGNOSIS — R3911 Hesitancy of micturition: Secondary | ICD-10-CM | POA: Diagnosis not present

## 2017-06-17 DIAGNOSIS — I10 Essential (primary) hypertension: Secondary | ICD-10-CM | POA: Diagnosis not present

## 2017-06-17 DIAGNOSIS — M545 Low back pain: Secondary | ICD-10-CM | POA: Diagnosis not present

## 2017-06-17 DIAGNOSIS — Z0000 Encounter for general adult medical examination without abnormal findings: Secondary | ICD-10-CM | POA: Diagnosis not present

## 2017-06-17 DIAGNOSIS — E782 Mixed hyperlipidemia: Secondary | ICD-10-CM | POA: Diagnosis not present

## 2017-06-17 DIAGNOSIS — K219 Gastro-esophageal reflux disease without esophagitis: Secondary | ICD-10-CM | POA: Diagnosis not present

## 2017-06-17 DIAGNOSIS — Z6824 Body mass index (BMI) 24.0-24.9, adult: Secondary | ICD-10-CM | POA: Diagnosis not present

## 2017-06-17 DIAGNOSIS — Z23 Encounter for immunization: Secondary | ICD-10-CM | POA: Diagnosis not present

## 2017-06-17 DIAGNOSIS — G4733 Obstructive sleep apnea (adult) (pediatric): Secondary | ICD-10-CM | POA: Diagnosis not present

## 2017-06-17 DIAGNOSIS — Z1389 Encounter for screening for other disorder: Secondary | ICD-10-CM | POA: Diagnosis not present

## 2017-06-17 DIAGNOSIS — I2510 Atherosclerotic heart disease of native coronary artery without angina pectoris: Secondary | ICD-10-CM | POA: Diagnosis not present

## 2017-06-25 DIAGNOSIS — Z1212 Encounter for screening for malignant neoplasm of rectum: Secondary | ICD-10-CM | POA: Diagnosis not present

## 2017-06-28 ENCOUNTER — Ambulatory Visit (INDEPENDENT_AMBULATORY_CARE_PROVIDER_SITE_OTHER): Payer: Medicare Other | Admitting: Neurology

## 2017-06-28 DIAGNOSIS — G4720 Circadian rhythm sleep disorder, unspecified type: Secondary | ICD-10-CM

## 2017-06-28 DIAGNOSIS — R51 Headache: Secondary | ICD-10-CM

## 2017-06-28 DIAGNOSIS — G4761 Periodic limb movement disorder: Secondary | ICD-10-CM

## 2017-06-28 DIAGNOSIS — G4733 Obstructive sleep apnea (adult) (pediatric): Secondary | ICD-10-CM | POA: Diagnosis not present

## 2017-06-28 DIAGNOSIS — R519 Headache, unspecified: Secondary | ICD-10-CM

## 2017-06-28 DIAGNOSIS — R030 Elevated blood-pressure reading, without diagnosis of hypertension: Secondary | ICD-10-CM

## 2017-06-28 DIAGNOSIS — Q359 Cleft palate, unspecified: Secondary | ICD-10-CM

## 2017-06-30 ENCOUNTER — Telehealth: Payer: Self-pay | Admitting: Cardiovascular Disease

## 2017-06-30 NOTE — Telephone Encounter (Signed)
Mr. Tyler Reid is calling because he is wanting to know if he needs to come in to see Dr. Cathie Olden to do lab work or to change medication  . He is currently  not taking the medication for his cholesterol. Please call

## 2017-06-30 NOTE — Telephone Encounter (Signed)
Patient states he stopped taking his Lipitor "months ago" because he could not take the allover body pain anymore. He saw his PCP in early September and had fasting labs drawn. He started him on Zetia 10 mg daily due to elevated LDL. He has had no issues with Zetia as of yet. Scheduled patient for yearly follow-up with Dr. Acie Fredrickson in December. He understands to come fasting to appointment to recheck cholesterol to reevaluate LDL. He was grateful for call and agrees with treatment plan.  PCP labs requested. Med list updated.

## 2017-07-02 NOTE — Addendum Note (Signed)
Addended by: [name] on: 07/02/2017 02:34 PM   Modules accepted: Orders

## 2017-07-02 NOTE — Progress Notes (Signed)
Patient seen in FU for OSA (previously treated with OA) on 05/20/17, diagnostic PSG on 06/09/17, CPAP study on 06/28/17. Please call and inform patient that I have entered an order for treatment with positive airway pressure (PAP) treatment of obstructive sleep apnea (OSA). He did well during the latest sleep study with CPAP. We will, therefore, arrange for a machine for home use through a DME (durable medical equipment) company of His choice; and I will see the patient back in follow-up in about 10 weeks. Please also explain to the patient that I will be looking out for compliance data, which can be downloaded from the machine (stored on an SD card, that is inserted in the machine) or via remote access through a modem, that is built into the machine. At the time of the followup appointment we will discuss sleep study results and how it is going with PAP treatment at home. Please advise patient to bring His machine at the time of the first FU visit, even though this is cumbersome. Bringing the machine for every visit after that will likely not be needed, but often helps for the first visit to troubleshoot if needed. Please re-enforce the importance of compliance with treatment and the need for [place] to monitor compliance data - often an insurance requirement and actually good feedback for the patient as far as how they are doing.  Also remind patient, that any interim PAP machine or mask issues should be first addressed with the DME company, as they can often help better with technical and mask fit issues. Please ask if patient has a preference regarding DME company.  Please also make sure, the patient has a follow-up appointment with me in about 10 weeks from the setup date, thanks.  Once you have spoken to the patient - and faxed/routed report to PCP and referring MD (if other than PCP), you can close this encounter, thanks,   [name], MD, PhD [facility] ([place])

## 2017-07-02 NOTE — Procedures (Signed)
PATIENT'S NAME:  Tyler Reid, Tyler Reid DOB:      March 09, 1946      MR#:    [id number]     DATE OF RECORDING: 06/28/2017 REFERRING M.D.:  Leanna Battles MD Study Performed:   CPAP  Titration HISTORY:  71 year old man with a history of hypertension, cleft palate (s/p repair at age 29 and additional surgeries as a teenager), overweight state, vitamin D deficiency and allergies, who presents for a full night PAP titration study to treat his OSA. He had a baseline sleep study 06/09/17, which showed a total AHI of 16.1/hour and O2 nadir of 79%. Time spent below 89% saturation equaled 23 minutes. He was treated with a dental device some years ago. He has noted recent increases in BP values in the mornings. Epworth Sleepiness Score was 9 points. BMI of 24.9 kg/m2. The patient's neck circumference measured 15 inches.  CURRENT MEDICATIONS: Norvasc, Astelin, Zyrtec, Vistaril, Cozaar, Metrogel, Zantac, Ubiquinol   PROCEDURE:  This is a multichannel digital polysomnogram utilizing the SomnoStar 11.2 system.  Electrodes and sensors were applied and monitored per AASM Specifications.   EEG, EOG, Chin and Limb EMG, were sampled at 200 Hz.  ECG, Snore and Nasal Pressure, Thermal Airflow, Respiratory Effort, CPAP Flow and Pressure, Oximetry was sampled at 50 Hz. Digital video and audio were recorded.      The patient was fitted with medium P10 nasal pillows. CPAP was initiated at 7 cmH20 with heated humidity per AASM standard and pressure was advanced to 9 cmH20 because of hypopneas, apneas and desaturations.  At a PAP pressure of 9 cmH20, there was a reduction of the AHI to 5.1/hour with non-supine REM sleep achieved, O2 nadir of 93%.    Lights Out was at 22:14 and Lights On at 05:16. Total recording time (TRT) was 422 minutes, with a total sleep time (TST) of 352.5 minutes. The patient's sleep latency was 16 minutes. REM latency was 209 minutes, which is delayed. The sleep efficiency was 83.5 %.    SLEEP ARCHITECTURE: WASO  (Wake after sleep onset) was 53.5 minutes.  There were 10 minutes in Stage N1, 169.5 minutes Stage N2, 127.5 minutes Stage N3 and 45.5 minutes in Stage REM.  The percentage of Stage N1 was 2.8%, Stage N2 was 48.1%, which is normal, Stage N3 was 36.2%, which is increased, and Stage R (REM sleep) was 12.9%, which is reduced. The arousals were noted as: 15 were spontaneous, 5 were associated with PLMs, 41 were associated with respiratory events.  Audio and video analysis did not show any abnormal or unusual movements, behaviors, phonations or vocalizations. The patient took 2 bathroom breaks. The EKG was in keeping with normal sinus rhythm (NSR).  RESPIRATORY ANALYSIS:  There was a total of 41 respiratory events: 18 obstructive apneas, 21 central apneas and 0 mixed apneas with a total of 39 apneas and an apnea index (AI) of 6.6 /hour. There were 2 hypopneas with a hypopnea index of .3/hour. The patient also had 0 respiratory event related arousals (RERAs).      The total APNEA/HYPOPNEA INDEX  (AHI) was 7. /hour and the total RESPIRATORY DISTURBANCE INDEX was 7. .hour  7 events occurred in REM sleep and 34 events in NREM. The REM AHI was 9.2 /hour versus a non-REM AHI of 6.6 /hour.  The patient spent 17 minutes of total sleep time in the supine position and 336 minutes in non-supine. The supine AHI was 81.2, versus a non-supine AHI of 3.2.  OXYGEN SATURATION & C02:  The baseline 02 saturation was 99%, with the lowest being 85%. Time spent below 89% saturation equaled 2 minutes.  PERIODIC LIMB MOVEMENTS: The patient had a total of 138 Periodic Limb Movements. The Periodic Limb Movement (PLM) index was 23.5 and the PLM Arousal index was .9 /hour.  Post-study, the patient indicated that sleep was better than usual.   DIAGNOSIS 1. Obstructive Sleep Apnea  2. Periodic Limb Movement Disorder  3. Dysfunction associated with Sleep stages or arousal from Sleep    PLANS/RECOMMENDATIONS: 1. This study  demonstrates near-resolution of the patient's obstructive sleep apnea with CPAP therapy. Due to the final AHI of 5.1/hour on a pressure of 9 cm and only non-supine REM sleep achieved, I will recommend a home CPAP treatment pressure of 10 cm via medium nasal pillows with heated humidity. The patient should be reminded to be fully compliant with PAP therapy to improve sleep related symptoms and decrease long term cardiovascular risks. The patient should be reminded, that it may take up to 3 months to get fully used to using PAP with all planned sleep. The earlier full compliance is achieved, the better long term compliance tends to be. Please note that untreated obstructive sleep apnea carries additional perioperative morbidity. Patients with significant obstructive sleep apnea should receive perioperative PAP therapy and the surgeons and particularly the anesthesiologist should be informed of the diagnosis and the severity of the sleep disordered breathing. 2. This study shows sleep fragmentation and abnormal sleep stage percentages; these are nonspecific findings and per se do not signify an intrinsic sleep disorder or a cause for the patient's sleep-related symptoms. Causes include (but are not limited to) the first night effect of the sleep study, circadian rhythm disturbances, medication effect or an underlying mood disorder or medical problem.  3. Mild PLMs (periodic limb movements of sleep) were noted during the study only without significant arousals; clinical correlation is recommended. 4. The patient should be cautioned not to drive, work at heights, or operate dangerous or heavy equipment when tired or sleepy. Review and reiteration of good sleep hygiene measures should be pursued with any patient. 5. The patient will be seen in follow-up by Dr. Rexene Alberts at [facility] for discussion of the test results and further management strategies. The referring provider will be notified of the test results.  I certify  that I have reviewed the entire raw data recording prior to the issuance of this report in accordance with the Standards of Accreditation of the American Academy of Sleep Medicine (AASM)   [name], MD, PHD Diplomat, American Board of Psychiatry and Neurology (Neurology and Sleep Medicine)

## 2017-07-05 ENCOUNTER — Telehealth: Payer: Self-pay | Admitting: Neurology

## 2017-07-05 NOTE — Telephone Encounter (Signed)
-----   Message from [name], MD sent at 07/02/2017  2:34 PM EDT ----- Patient seen in FU for OSA (previously treated with OA) on 05/20/17, diagnostic PSG on 06/09/17, CPAP study on 06/28/17. Please call and inform patient that I have entered an order for treatment with positive airway pressure (PAP) treatment of obstructive sleep apnea (OSA). He did well during the latest sleep study with CPAP. We will, therefore, arrange for a machine for home use through a DME (durable medical equipment) company of His choice; and I will see the patient back in follow-up in about 10 weeks. Please also explain to the patient that I will be looking out for compliance data, which can be downloaded from the machine (stored on an SD card, that is inserted in the machine) or via remote access through a modem, that is built into the machine. At the time of the followup appointment we will discuss sleep study results and how it is going with PAP treatment at home. Please advise patient to bring His machine at the time of the first FU visit, even though this is cumbersome. Bringing the machine for every visit after that will likely not be needed, but often helps for the first visit to troubleshoot if needed. Please re-enforce the importance of compliance with treatment and the need for [place] to monitor compliance data - often an insurance requirement and actually good feedback for the patient as far as how they are doing.  Also remind patient, that any interim PAP machine or mask issues should be first addressed with the DME company, as they can often help better with technical and mask fit issues. Please ask if patient has a preference regarding DME company.  Please also make sure, the patient has a follow-up appointment with me in about 10 weeks from the setup date, thanks.  Once you have spoken to the patient - and faxed/routed report to PCP and referring MD (if other than PCP), you can close this encounter, thanks,   [name],  MD, PhD [facility] ([place])

## 2017-07-05 NOTE — Telephone Encounter (Signed)
Pt called request status of order for CPAP. Please call

## 2017-07-05 NOTE — Telephone Encounter (Signed)
I called pt. I advised pt that Dr. Rexene Alberts reviewed their sleep study results and found that pt did well with the cpap during his latest sleep study. Dr. Rexene Alberts recommends that pt start a cpap at home. I reviewed PAP compliance expectations with the pt. Pt is agreeable to starting a CPAP. I advised pt that an order will be sent to a DME, [facility], and [facility] will call the pt within about one week after they file with the pt's insurance. [facility] will show the pt how to use the machine, fit for masks, and troubleshoot the CPAP if needed. A follow up appt was made for insurance purposes with Dr. Rexene Alberts on 09/21/17 at 11:30am. Pt verbalized understanding to arrive 15 minutes early and bring their CPAP. A letter with all of this information in it will be mailed to the pt as a reminder. I verified with the pt that the address we have on file is correct. Pt asked that I mail him a copy of both of his sleep studies as well. Pt verbalized understanding of results. Pt had no questions at this time but was encouraged to call back if questions arise.

## 2017-07-06 DIAGNOSIS — Z23 Encounter for immunization: Secondary | ICD-10-CM | POA: Diagnosis not present

## 2017-07-22 DIAGNOSIS — J3089 Other allergic rhinitis: Secondary | ICD-10-CM | POA: Diagnosis not present

## 2017-07-22 DIAGNOSIS — K219 Gastro-esophageal reflux disease without esophagitis: Secondary | ICD-10-CM | POA: Diagnosis not present

## 2017-07-22 DIAGNOSIS — J4520 Mild intermittent asthma, uncomplicated: Secondary | ICD-10-CM | POA: Diagnosis not present

## 2017-07-22 DIAGNOSIS — J300 Vasomotor rhinitis: Secondary | ICD-10-CM | POA: Diagnosis not present

## 2017-08-11 ENCOUNTER — Telehealth: Payer: Self-pay

## 2017-08-11 DIAGNOSIS — G4739 Other sleep apnea: Secondary | ICD-10-CM

## 2017-08-11 DIAGNOSIS — Z9989 Dependence on other enabling machines and devices: Principal | ICD-10-CM

## 2017-08-11 DIAGNOSIS — G4733 Obstructive sleep apnea (adult) (pediatric): Secondary | ICD-10-CM

## 2017-08-11 DIAGNOSIS — G4731 Primary central sleep apnea: Secondary | ICD-10-CM

## 2017-08-11 NOTE — Telephone Encounter (Signed)
I called pt. I explained that his therapy report shows some central sleep apnea and therefore Dr. Rexene Alberts has ordered a pressure reduction to hopefully help with the treatment of the central apnea. If the pressure reduction does not hep reduce the csa, pt may need to try an auto bipap or a bipap titration. Pt is agreeable to this. I advised pt that the order was sent to [facility]. Pt verbalized understanding.

## 2017-08-11 NOTE — Telephone Encounter (Signed)
I reviewed patient's CPAP compliance data from 07/12/2017 through 08/10/2017, which is a total of 30 days, during which time he used his machine 28 days with percent used days greater than 4 hours at 90%, indicating excellent compliance with an average usage of 7 hours and 22 minutes, residual AHI is indeed elevated at 19.3 per hour mostly because of central events. Leak is on the higher side with the 95th percentile at 17.9 L/m on a pressure of 10 cm with EPR of 3. I would like to reduce his pressure to 8 cm at this time to see if we can reduce the treatment emergent centrals. We will try to pull another 30 day compliance download in a month from now for comparison. Should this not work out, he may be able to try AutoPap BiPAP or may need to come in for a BiPAP titration. Please notify patient and send order to DME company.

## 2017-08-11 NOTE — Telephone Encounter (Signed)
Received a call from [facility]. Pt had called them worried about a high leak from his mask. When they reviewed his therapy data, they are concerned about the number of central apneas that is showing on the data and is wondering if the pt should be switched to a bipap. Pt has an upcoming appt with Dr. Rexene Alberts on 09/21/17.

## 2017-08-23 ENCOUNTER — Ambulatory Visit: Payer: Medicare Other | Admitting: Cardiovascular Disease

## 2017-08-30 ENCOUNTER — Encounter: Payer: Self-pay | Admitting: Neurology

## 2017-08-31 NOTE — Telephone Encounter (Signed)
I called pt, left a message on pt's home phone number, per DPR, advising him that Dr. Rexene Alberts reviewed his cpap therapy report and says that pt's AHI has improved but pt still has residual central sleep apnea but it is better and Dr. Rexene Alberts wants to keep his pressure setting the same. Dr. Rexene Alberts will review pt's data again at his appt on 09/21/17. I asked pt to call me back with any questions or concerns.

## 2017-08-31 NOTE — Telephone Encounter (Signed)
Pt has called to inform that he wanted to see how he was progressing re: the reduction in pressure and has been told by [facility] that there may now want to be consideration of raising the pressure to 9 because 8 seems to be too low.  Pt is asking for a call back and states a message can be left if he is not available

## 2017-08-31 NOTE — Telephone Encounter (Signed)
AHI has improved, still residual centrals, but better. I would like to keep pressure setting the same. Keep FU for 09/21/17. Keep up the good work! Please relay back to patient.

## 2017-09-17 DIAGNOSIS — X32XXXA Exposure to sunlight, initial encounter: Secondary | ICD-10-CM | POA: Diagnosis not present

## 2017-09-17 DIAGNOSIS — L570 Actinic keratosis: Secondary | ICD-10-CM | POA: Diagnosis not present

## 2017-09-17 DIAGNOSIS — D2262 Melanocytic nevi of left upper limb, including shoulder: Secondary | ICD-10-CM | POA: Diagnosis not present

## 2017-09-17 DIAGNOSIS — L820 Inflamed seborrheic keratosis: Secondary | ICD-10-CM | POA: Diagnosis not present

## 2017-09-17 DIAGNOSIS — Z1283 Encounter for screening for malignant neoplasm of skin: Secondary | ICD-10-CM | POA: Diagnosis not present

## 2017-09-17 DIAGNOSIS — D225 Melanocytic nevi of trunk: Secondary | ICD-10-CM | POA: Diagnosis not present

## 2017-09-19 ENCOUNTER — Encounter: Payer: Self-pay | Admitting: Neurology

## 2017-09-21 ENCOUNTER — Ambulatory Visit (INDEPENDENT_AMBULATORY_CARE_PROVIDER_SITE_OTHER): Payer: Medicare Other | Admitting: Neurology

## 2017-09-21 ENCOUNTER — Encounter: Payer: Self-pay | Admitting: Neurology

## 2017-09-21 VITALS — BP 113/70 | HR 70 | Ht 70.0 in | Wt 175.0 lb

## 2017-09-21 DIAGNOSIS — G4733 Obstructive sleep apnea (adult) (pediatric): Secondary | ICD-10-CM

## 2017-09-21 DIAGNOSIS — Z9989 Dependence on other enabling machines and devices: Secondary | ICD-10-CM | POA: Diagnosis not present

## 2017-09-21 NOTE — Patient Instructions (Signed)
You can certainly try your oral appliance with the CPAP in conjunction.   Talk to the oral surgeon who made your oral appliance for an updated appliance.   Please continue using your CPAP regularly. While your insurance requires that you use CPAP at least 4 hours each night on 70% of the nights, I recommend, that you not skip any nights and use it throughout the night if you can. Getting used to CPAP and staying with the treatment long term does take time and patience and discipline. Untreated obstructive sleep apnea when it is moderate to severe can have an adverse impact on cardiovascular health and raise her risk for heart disease, arrhythmias, hypertension, congestive heart failure, stroke and diabetes. Untreated obstructive sleep apnea causes sleep disruption, nonrestorative sleep, and sleep deprivation. This can have an impact on your day to day functioning and cause daytime sleepiness and impairment of cognitive function, memory loss, mood disturbance, and problems focussing. Using CPAP regularly can improve these symptoms.  Try the air touch mask we gave you and see if it feels better.   Keep up the good work! I will see you back in 6 months for sleep apnea check up, and if you continue to do well on CPAP I will see you once a year thereafter.
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
## 2017-10-27 ENCOUNTER — Encounter (INDEPENDENT_AMBULATORY_CARE_PROVIDER_SITE_OTHER): Payer: Self-pay

## 2017-10-27 ENCOUNTER — Encounter: Payer: Self-pay | Admitting: Cardiovascular Disease

## 2017-10-27 ENCOUNTER — Ambulatory Visit (INDEPENDENT_AMBULATORY_CARE_PROVIDER_SITE_OTHER): Payer: Medicare Other | Admitting: Cardiovascular Disease

## 2017-10-27 VITALS — BP 114/64 | HR 58 | Ht 70.0 in | Wt 179.0 lb

## 2017-10-27 DIAGNOSIS — E782 Mixed hyperlipidemia: Secondary | ICD-10-CM

## 2017-10-27 LAB — LIPID PANEL
CHOLESTEROL TOTAL: 163 mg/dL (ref 100–199)
Chol/HDL Ratio: 4.1 ratio (ref 0.0–5.0)
HDL: 40 mg/dL (ref >39)
LDL Calculated: 94 mg/dL (ref 0–99)
Triglycerides: 146 mg/dL (ref 0–149)
VLDL Cholesterol Cal: 29 mg/dL (ref 5–40)

## 2017-10-27 LAB — HEPATIC FUNCTION PANEL
ALBUMIN: 4.5 g/dL (ref 3.5–4.8)
ALK PHOS: 63 IU/L (ref 39–117)
ALT: 27 IU/L (ref 0–44)
AST: 28 IU/L (ref 0–40)
BILIRUBIN TOTAL: 0.5 mg/dL (ref 0.0–1.2)
BILIRUBIN, DIRECT: 0.15 mg/dL (ref 0.00–0.40)
TOTAL PROTEIN: 6.7 g/dL (ref 6.0–8.5)

## 2017-10-27 LAB — BASIC METABOLIC PANEL
BUN/Creatinine Ratio: 9 — ABNORMAL LOW (ref 10–24)
BUN: 9 mg/dL (ref 8–27)
CALCIUM: 9.7 mg/dL (ref 8.6–10.2)
CO2: 26 mmol/L (ref 20–29)
CREATININE: 1.02 mg/dL (ref 0.76–1.27)
Chloride: 98 mmol/L (ref 96–106)
GFR calc Af Amer: 84 mL/min/{1.73_m2} (ref >59)
GFR calc non Af Amer: 73 mL/min/{1.73_m2} (ref >59)
Glucose: 93 mg/dL (ref 65–99)
Potassium: 4.7 mmol/L (ref 3.5–5.2)
SODIUM: 138 mmol/L (ref 134–144)

## 2017-10-27 NOTE — Patient Instructions (Signed)
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
## 2017-10-29 ENCOUNTER — Telehealth: Payer: Self-pay | Admitting: Cardiovascular Disease

## 2017-10-29 DIAGNOSIS — I2510 Atherosclerotic heart disease of native coronary artery without angina pectoris: Secondary | ICD-10-CM

## 2017-10-29 DIAGNOSIS — E782 Mixed hyperlipidemia: Secondary | ICD-10-CM

## 2017-10-29 NOTE — Telephone Encounter (Signed)
New message  Pt verbalized that he is returning call for RN  He said he want to make sure his labs were mailed to him

## 2017-10-29 NOTE — Telephone Encounter (Signed)
ASSESSMENT AND PLAN:  1.  Coronary calcifications:   - had a negative myoview   2. Hyperlipidemia:  - is on Zetia.   Willing to enroll in the Bepedoic acid study ( Clear Study )   3. Bradycardia:     - stable   Current medicines are reviewed at length with the patient today.  The patient does not have concerns regarding medicines.  The following changes have been made:  no change  Labs/ tests ordered today include:  No orders of the defined types were placed in this encounter.   Disposition:   FU with me in a year .     Mertie Moores, MD  10/27/2017 8:55 AM    [address], [place], [place]  [address] Phone: [phone]; Fax: [phone]    Pt is calling to ask Sharyn Lull RN and Dr. Acie Fredrickson 2 things:  1.) Pt wants to first ask if his lab results were mailed to his address, and if not, would Sharyn Lull RN please do so.   2.) 2nd thing pt would like to ask, is if Dr. Acie Fredrickson would still like for him to enroll in the Bepedoic acid study, as mentioned at his last OV.  Pt states he is more than willing to do this, if Dr. Acie Fredrickson and Sharyn Lull RN feel there is a need.  Informed the pt that Dr Acie Fredrickson and RN are both out of the office today, but I will route this message to them for further review and follow-up with the pt, upon return to the office. Pt verbalized understanding and agrees with this plan.

## 2017-10-31 NOTE — Telephone Encounter (Signed)
Will forward this info to our Pharmacy team to get him set up with our research team

## 2017-11-02 NOTE — Telephone Encounter (Signed)
Spoke with research department - pt does not qualify for the CLEAR study because his carotid ultrasound did not show at least 70% calcifications. He would qualify for the study if he had a coronary calcium score that was at least 400.  Will route to Dr Acie Fredrickson and Sharyn Lull to help assist with calcium scoring if he feels it is warranted to see if it will help pt become enrolled in the CLEAR trial.

## 2017-11-02 NOTE — Telephone Encounter (Signed)
Please order a coronary calcium score CT scan Will see if he qualifies

## 2017-11-03 NOTE — Telephone Encounter (Signed)
Spoke with patient to advise that Dr. Acie Fredrickson would like to order CT calcium score. He asks if he will be needing a repeat chest CT to follow-up lung nodules from Dr. Philip Aspen. I advised that according to the last chest CT, there is no follow-up required for that. I advised that someone from our office will call him to schedule the calcium score and that I will call back with the results once that test is completed. I advised I am placing copies of his most recent lab results in the mail. He verbalized understanding and agreement with plan and thanked me for the call.

## 2017-11-09 ENCOUNTER — Ambulatory Visit (INDEPENDENT_AMBULATORY_CARE_PROVIDER_SITE_OTHER)
Admission: RE | Admit: 2017-11-09 | Discharge: 2017-11-09 | Disposition: A | Discharge status: Home / Self Care | Payer: Self-pay | Source: Ambulatory Visit | Attending: Cardiovascular Disease | Admitting: Cardiovascular Disease

## 2017-11-09 DIAGNOSIS — E782 Mixed hyperlipidemia: Secondary | ICD-10-CM

## 2017-11-09 DIAGNOSIS — I2510 Atherosclerotic heart disease of native coronary artery without angina pectoris: Secondary | ICD-10-CM

## 2017-11-09 NOTE — Telephone Encounter (Signed)
Tyler Reid - please see coronary calcium score today, elevated > 400 so patient should qualify for CLEAR trial.

## 2017-11-16 IMAGING — NM NM MYOCAR MULTI W/ SPECT
3 series · 18 of 18 positions shown · non-contrast
Comparison: none

[Series 1: rest · 6.51mm/px · 6 of 64 frames shown]
[frame 6/64]
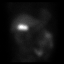
[frame 16/64]
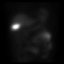
[frame 27/64]
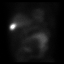
[frame 38/64]
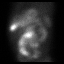
[frame 48/64]
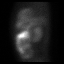
[frame 59/64]
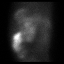

[Series 2: stress · 6.51mm/px · 6 of 64 frames shown (1 of 2)]
[frame 6/64]
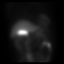
[frame 16/64]
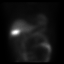
[frame 27/64]
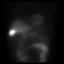
[frame 38/64]
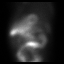
[frame 48/64]
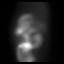
[frame 59/64]
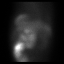

[Series 2: stress · 6.51mm/px · 6 of 512 frames shown (2 of 2)]
[frame 43/512]
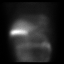
[frame 128/512]
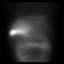
[frame 214/512]
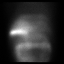
[frame 299/512]
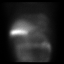
[frame 384/512]
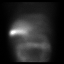
[frame 470/512]
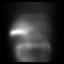

[18 of 18 positions shown; findings below may reference images not displayed]

Canned report from images found in remote index.

Refer to host system for actual result text.

## 2017-11-19 ENCOUNTER — Telehealth: Payer: Self-pay | Admitting: *Deleted

## 2017-11-19 NOTE — Telephone Encounter (Signed)
Spoke with patient regarding the Clear Research study.  Scheduled for a screening appointment on Thursday November 25, 2017 @0800 .

## 2017-11-21 ENCOUNTER — Encounter: Payer: Self-pay | Admitting: Neurology

## 2017-11-22 ENCOUNTER — Telehealth: Payer: Self-pay | Admitting: Neurology

## 2017-11-22 NOTE — Telephone Encounter (Signed)
I called pt to discuss, no answer, left a message asking him to call me back. 

## 2017-11-22 NOTE — Telephone Encounter (Signed)
Pt is on CPAP, he had reports printed out.  He was told the air pressure needs adjusting. Pt is asking for a call back to discuss

## 2017-11-22 NOTE — Telephone Encounter (Signed)
I reviewed the patient's CPAP compliance data from 10/23/17 to 11/21/17, which is a total of 30 days, during which time the patient used CPAP every day. The average usage for all days was 6 hours and 52 minutes. The percent used days greater than 4 hours was 100%, indicating superb compliance. The residual AHI was 4 per hour, indicating an adequate treatment pressure of 8 cwp with EPR of 3. Air leak from the mask was negligible. I would like to continue the patient on this setting and not change anything. Pt is commended for his great job on using CPAP. He can FU as scheduled in July. Pls notify pt.

## 2017-11-22 NOTE — Telephone Encounter (Signed)
I called Tyler Reid. He reports that [facility] mentioned to him that he may be a candidate for an auto pap. I explained to him that his decision if he is a candidate for auto pap comes from a sleep physician. It will be up to Tyler Reid to write this order for Tyler Reid.  Tyler Reid reports that he is doing well on his current pressure of 8 cmH2O and is using a full face mask. Tyler Reid's AHI and central apnea numbers have been below 5, for which Tyler Reid is pleased. Tyler Reid is using his oral appliance in conjunction with his cpap.  Tyler Reid wants Tyler Reid to review his last 30 day compliance and therapy report and let him know if any changes are needed.

## 2017-11-23 NOTE — Telephone Encounter (Signed)
I called pt and explained Dr. Guadelupe Sabin recommendations. Pt is agreeable to this plan and will follow up as scheduled in July.

## 2017-11-25 ENCOUNTER — Encounter: Payer: Self-pay | Admitting: *Deleted

## 2017-11-25 DIAGNOSIS — Z006 Encounter for examination for normal comparison and control in clinical research program: Secondary | ICD-10-CM

## 2017-11-25 NOTE — Progress Notes (Signed)
Subject to research clinic for visit S1-W5 in the Clear Research study.  Informed consent signed before any procedures were done.  All elements of the informed consent form, study requirements and expectations were reviewed with the subject. All questions and concerns were identified and addressed prior to the signing of the consent.  No procedures were performed prior to consenting the subject. The subject was given an adequate amount of time to make an informed decision. A copy of the consent was provided to the patient to take home.  Jake Bathe, RN 11/25/2017 [phone]

## 2017-12-10 ENCOUNTER — Other Ambulatory Visit: Payer: Self-pay | Admitting: *Deleted

## 2017-12-10 ENCOUNTER — Encounter: Payer: Self-pay | Admitting: *Deleted

## 2017-12-10 DIAGNOSIS — Z006 Encounter for examination for normal comparison and control in clinical research program: Secondary | ICD-10-CM

## 2017-12-10 MED ORDER — AMBULATORY NON FORMULARY MEDICATION: 180.0000 mg | Freq: Every day | Status: DC

## 2017-12-10 NOTE — Progress Notes (Signed)
Late entry: Subject to research clinic for visit S2-W4 in the Clear research study on December 08, 2017. No c/o, aes or saes to report per subject.  Started in the run in phase of the subject.  Next visit scheduled.

## 2017-12-14 DIAGNOSIS — R42 Dizziness and giddiness: Secondary | ICD-10-CM | POA: Diagnosis not present

## 2017-12-14 DIAGNOSIS — G4733 Obstructive sleep apnea (adult) (pediatric): Secondary | ICD-10-CM | POA: Diagnosis not present

## 2017-12-14 DIAGNOSIS — Z6825 Body mass index (BMI) 25.0-25.9, adult: Secondary | ICD-10-CM | POA: Diagnosis not present

## 2017-12-14 DIAGNOSIS — M545 Low back pain: Secondary | ICD-10-CM | POA: Diagnosis not present

## 2017-12-14 DIAGNOSIS — I10 Essential (primary) hypertension: Secondary | ICD-10-CM | POA: Diagnosis not present

## 2017-12-14 DIAGNOSIS — I2510 Atherosclerotic heart disease of native coronary artery without angina pectoris: Secondary | ICD-10-CM | POA: Diagnosis not present

## 2018-01-05 ENCOUNTER — Encounter: Payer: Self-pay | Admitting: *Deleted

## 2018-01-05 DIAGNOSIS — Z006 Encounter for examination for normal comparison and control in clinical research program: Secondary | ICD-10-CM

## 2018-01-05 NOTE — Progress Notes (Signed)
Subject to research clinic for visit T1-W0 in the Clear Research study.  No c/o, aes or saes to report.  97% compliant with run in drug and new drug dispensed after randomization into the trial.  Next appointment scheduled.  Re-consented to [facility] [place] version 6.0.

## 2018-01-31 DIAGNOSIS — H16223 Keratoconjunctivitis sicca, not specified as Sjogren's, bilateral: Secondary | ICD-10-CM | POA: Diagnosis not present

## 2018-01-31 DIAGNOSIS — H01009 Unspecified blepharitis unspecified eye, unspecified eyelid: Secondary | ICD-10-CM | POA: Diagnosis not present

## 2018-01-31 DIAGNOSIS — H35371 Puckering of macula, right eye: Secondary | ICD-10-CM | POA: Diagnosis not present

## 2018-02-02 ENCOUNTER — Encounter: Payer: Medicare Other | Admitting: *Deleted

## 2018-02-02 VITALS — BP 117/62 | HR 50 | Wt 173.6 lb

## 2018-02-02 DIAGNOSIS — Z006 Encounter for examination for normal comparison and control in clinical research program: Secondary | ICD-10-CM

## 2018-02-02 NOTE — Progress Notes (Signed)
Subject to research clinic for Visit T2-M1 in the clear research study.  No c/o, aes or saes to report.  100% compliant with meds, counted and reissued to subject.  Next visit scheduled.

## 2018-02-22 ENCOUNTER — Ambulatory Visit (INDEPENDENT_AMBULATORY_CARE_PROVIDER_SITE_OTHER): Payer: Medicare Other | Admitting: Neurology

## 2018-02-22 ENCOUNTER — Encounter: Payer: Self-pay | Admitting: Neurology

## 2018-02-22 VITALS — BP 121/73 | HR 59 | Ht 70.0 in | Wt 176.5 lb

## 2018-02-22 DIAGNOSIS — I2510 Atherosclerotic heart disease of native coronary artery without angina pectoris: Secondary | ICD-10-CM

## 2018-02-22 DIAGNOSIS — Z9989 Dependence on other enabling machines and devices: Secondary | ICD-10-CM | POA: Diagnosis not present

## 2018-02-22 DIAGNOSIS — G4733 Obstructive sleep apnea (adult) (pediatric): Secondary | ICD-10-CM
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
## 2018-02-22 NOTE — Patient Instructions (Signed)

## 2018-03-21 ENCOUNTER — Ambulatory Visit: Payer: Medicare Other | Admitting: Neurology

## 2018-03-21 ENCOUNTER — Telehealth: Payer: Self-pay | Admitting: Cardiovascular Disease

## 2018-03-21 NOTE — Telephone Encounter (Signed)
New message    Pt c/o BP issue: STAT if pt c/o blurred vision, one-sided weakness or slurred speech  1. What are your last 5 BP readings? Left 144/72/61, right 126/72/60  2. Are you having any other symptoms (ex. Dizziness, headache, blurred vision, passed out)? NO  3. What is your BP issue? Patient states he is concerned about the variation in pressure in each arm. Requesting nurse call

## 2018-03-21 NOTE — Telephone Encounter (Signed)
The BP difference is very minimal  We will recheck when he is in the office at his next scheduled appt.

## 2018-03-21 NOTE — Telephone Encounter (Signed)
Called patient back with Dr. Elmarie Shiley advisement. Patient verbalized understanding.

## 2018-03-21 NOTE — Telephone Encounter (Signed)
Patient is concerned about his BP being more elevated in his left arm, then his right arm. Informed patient that it all depends on how he is taking his BPs. Also, informed patient that a slight difference once or twice should not cause too much concern.  Patient stated this has been going on for a couple of weeks. Patient is asymptomatic and has no other concerns. Informed patient that his message would be sent to Dr. Acie Fredrickson for advisement.

## 2018-04-05 ENCOUNTER — Encounter: Payer: Medicare Other | Admitting: *Deleted

## 2018-04-05 ENCOUNTER — Encounter: Payer: Self-pay | Admitting: *Deleted

## 2018-04-05 DIAGNOSIS — Z006 Encounter for examination for normal comparison and control in clinical research program: Secondary | ICD-10-CM

## 2018-04-05 DIAGNOSIS — L820 Inflamed seborrheic keratosis: Secondary | ICD-10-CM | POA: Diagnosis not present

## 2018-04-05 NOTE — Progress Notes (Signed)
Subject to research clinic for visit T3M3 in the Clear research program. No cos, aes or saes to report.  Subject >80% compliant with meds and new drug dispensed.  Next clinic visit scheduled.

## 2018-05-25 DIAGNOSIS — H35371 Puckering of macula, right eye: Secondary | ICD-10-CM | POA: Diagnosis not present

## 2018-05-25 DIAGNOSIS — Z961 Presence of intraocular lens: Secondary | ICD-10-CM | POA: Diagnosis not present

## 2018-05-25 DIAGNOSIS — H16223 Keratoconjunctivitis sicca, not specified as Sjogren's, bilateral: Secondary | ICD-10-CM | POA: Diagnosis not present

## 2018-05-25 DIAGNOSIS — H01009 Unspecified blepharitis unspecified eye, unspecified eyelid: Secondary | ICD-10-CM | POA: Diagnosis not present

## 2018-05-30 DIAGNOSIS — H35073 Retinal telangiectasis, bilateral: Secondary | ICD-10-CM | POA: Diagnosis not present

## 2018-05-30 DIAGNOSIS — H35371 Puckering of macula, right eye: Secondary | ICD-10-CM | POA: Diagnosis not present

## 2018-05-30 DIAGNOSIS — Z961 Presence of intraocular lens: Secondary | ICD-10-CM | POA: Diagnosis not present

## 2018-06-15 DIAGNOSIS — Z961 Presence of intraocular lens: Secondary | ICD-10-CM | POA: Diagnosis not present

## 2018-06-15 DIAGNOSIS — I10 Essential (primary) hypertension: Secondary | ICD-10-CM | POA: Diagnosis not present

## 2018-06-15 DIAGNOSIS — H35371 Puckering of macula, right eye: Secondary | ICD-10-CM | POA: Diagnosis not present

## 2018-06-15 DIAGNOSIS — H18413 Arcus senilis, bilateral: Secondary | ICD-10-CM | POA: Diagnosis not present

## 2018-06-15 DIAGNOSIS — H16223 Keratoconjunctivitis sicca, not specified as Sjogren's, bilateral: Secondary | ICD-10-CM | POA: Diagnosis not present

## 2018-06-15 DIAGNOSIS — H43813 Vitreous degeneration, bilateral: Secondary | ICD-10-CM | POA: Diagnosis not present

## 2018-07-01 DIAGNOSIS — I10 Essential (primary) hypertension: Secondary | ICD-10-CM | POA: Diagnosis not present

## 2018-07-01 DIAGNOSIS — E782 Mixed hyperlipidemia: Secondary | ICD-10-CM | POA: Diagnosis not present

## 2018-07-01 DIAGNOSIS — R82998 Other abnormal findings in urine: Secondary | ICD-10-CM | POA: Diagnosis not present

## 2018-07-01 DIAGNOSIS — Z125 Encounter for screening for malignant neoplasm of prostate: Secondary | ICD-10-CM | POA: Diagnosis not present

## 2018-07-06 ENCOUNTER — Encounter: Payer: Medicare Other | Admitting: *Deleted

## 2018-07-06 VITALS — BP 133/86 | HR 81 | Wt 173.0 lb

## 2018-07-06 DIAGNOSIS — Z006 Encounter for examination for normal comparison and control in clinical research program: Secondary | ICD-10-CM

## 2018-07-06 NOTE — Research (Signed)
Subject to research clinic for visit T4-M6 in the Clear research study.  No cos, aes or saes to report.  99% compliant with meds and new drug dispensed.  Next phone call and clinic visit scheduled.

## 2018-07-08 DIAGNOSIS — M545 Low back pain: Secondary | ICD-10-CM | POA: Diagnosis not present

## 2018-07-08 DIAGNOSIS — R3911 Hesitancy of micturition: Secondary | ICD-10-CM | POA: Diagnosis not present

## 2018-07-08 DIAGNOSIS — E782 Mixed hyperlipidemia: Secondary | ICD-10-CM | POA: Diagnosis not present

## 2018-07-08 DIAGNOSIS — G4733 Obstructive sleep apnea (adult) (pediatric): Secondary | ICD-10-CM | POA: Diagnosis not present

## 2018-07-08 DIAGNOSIS — Z6824 Body mass index (BMI) 24.0-24.9, adult: Secondary | ICD-10-CM | POA: Diagnosis not present

## 2018-07-08 DIAGNOSIS — J45998 Other asthma: Secondary | ICD-10-CM | POA: Diagnosis not present

## 2018-07-08 DIAGNOSIS — I10 Essential (primary) hypertension: Secondary | ICD-10-CM | POA: Diagnosis not present

## 2018-07-08 DIAGNOSIS — Z0000 Encounter for general adult medical examination without abnormal findings: Secondary | ICD-10-CM | POA: Diagnosis not present

## 2018-07-08 DIAGNOSIS — Z23 Encounter for immunization: Secondary | ICD-10-CM | POA: Diagnosis not present

## 2018-07-08 DIAGNOSIS — K219 Gastro-esophageal reflux disease without esophagitis: Secondary | ICD-10-CM | POA: Diagnosis not present

## 2018-07-08 DIAGNOSIS — Z1389 Encounter for screening for other disorder: Secondary | ICD-10-CM | POA: Diagnosis not present

## 2018-07-08 DIAGNOSIS — I2510 Atherosclerotic heart disease of native coronary artery without angina pectoris: Secondary | ICD-10-CM | POA: Diagnosis not present

## 2018-07-21 DIAGNOSIS — K219 Gastro-esophageal reflux disease without esophagitis: Secondary | ICD-10-CM | POA: Diagnosis not present

## 2018-07-21 DIAGNOSIS — J300 Vasomotor rhinitis: Secondary | ICD-10-CM | POA: Diagnosis not present

## 2018-07-21 DIAGNOSIS — J3089 Other allergic rhinitis: Secondary | ICD-10-CM | POA: Diagnosis not present

## 2018-07-26 DIAGNOSIS — L308 Other specified dermatitis: Secondary | ICD-10-CM | POA: Diagnosis not present

## 2018-07-26 DIAGNOSIS — L821 Other seborrheic keratosis: Secondary | ICD-10-CM | POA: Diagnosis not present

## 2018-07-26 DIAGNOSIS — D485 Neoplasm of uncertain behavior of skin: Secondary | ICD-10-CM | POA: Diagnosis not present

## 2018-07-26 DIAGNOSIS — D225 Melanocytic nevi of trunk: Secondary | ICD-10-CM | POA: Diagnosis not present

## 2018-07-26 DIAGNOSIS — L820 Inflamed seborrheic keratosis: Secondary | ICD-10-CM | POA: Diagnosis not present

## 2018-07-29 DIAGNOSIS — Z1212 Encounter for screening for malignant neoplasm of rectum: Secondary | ICD-10-CM | POA: Diagnosis not present

## 2018-08-15 DIAGNOSIS — H04123 Dry eye syndrome of bilateral lacrimal glands: Secondary | ICD-10-CM | POA: Diagnosis not present

## 2018-08-15 DIAGNOSIS — H5211 Myopia, right eye: Secondary | ICD-10-CM | POA: Diagnosis not present

## 2018-09-05 DIAGNOSIS — H04123 Dry eye syndrome of bilateral lacrimal glands: Secondary | ICD-10-CM | POA: Diagnosis not present

## 2018-10-10 ENCOUNTER — Encounter: Payer: Self-pay | Admitting: Neurology

## 2018-10-10 DIAGNOSIS — I2510 Atherosclerotic heart disease of native coronary artery without angina pectoris: Secondary | ICD-10-CM | POA: Diagnosis not present

## 2018-10-10 DIAGNOSIS — Z6824 Body mass index (BMI) 24.0-24.9, adult: Secondary | ICD-10-CM | POA: Diagnosis not present

## 2018-10-10 DIAGNOSIS — R0789 Other chest pain: Secondary | ICD-10-CM | POA: Diagnosis not present

## 2018-10-10 DIAGNOSIS — I10 Essential (primary) hypertension: Secondary | ICD-10-CM | POA: Diagnosis not present

## 2018-10-10 DIAGNOSIS — G4733 Obstructive sleep apnea (adult) (pediatric): Secondary | ICD-10-CM | POA: Diagnosis not present

## 2018-10-18 DIAGNOSIS — H04123 Dry eye syndrome of bilateral lacrimal glands: Secondary | ICD-10-CM | POA: Diagnosis not present

## 2018-10-31 ENCOUNTER — Encounter: Payer: Self-pay | Admitting: Cardiovascular Disease

## 2018-10-31 ENCOUNTER — Ambulatory Visit (INDEPENDENT_AMBULATORY_CARE_PROVIDER_SITE_OTHER): Payer: Medicare Other | Admitting: Cardiovascular Disease

## 2018-10-31 VITALS — BP 134/72 | HR 57 | Ht 70.0 in | Wt 172.1 lb

## 2018-10-31 DIAGNOSIS — E782 Mixed hyperlipidemia: Secondary | ICD-10-CM

## 2018-10-31 DIAGNOSIS — I10 Essential (primary) hypertension: Secondary | ICD-10-CM

## 2018-10-31 NOTE — Patient Instructions (Signed)
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
## 2018-11-04 ENCOUNTER — Telehealth: Payer: Self-pay | Admitting: *Deleted

## 2018-11-04 NOTE — Telephone Encounter (Signed)
Subject contacted for visit T5-M9 in the Clear research study.  Next appointment confirmed.

## 2018-11-18 DIAGNOSIS — E782 Mixed hyperlipidemia: Secondary | ICD-10-CM | POA: Diagnosis not present

## 2018-11-18 DIAGNOSIS — I10 Essential (primary) hypertension: Secondary | ICD-10-CM | POA: Diagnosis not present

## 2018-11-18 DIAGNOSIS — R0789 Other chest pain: Secondary | ICD-10-CM | POA: Diagnosis not present

## 2018-11-18 DIAGNOSIS — Z6825 Body mass index (BMI) 25.0-25.9, adult: Secondary | ICD-10-CM | POA: Diagnosis not present

## 2018-11-18 DIAGNOSIS — G4733 Obstructive sleep apnea (adult) (pediatric): Secondary | ICD-10-CM | POA: Diagnosis not present

## 2018-11-18 DIAGNOSIS — I2510 Atherosclerotic heart disease of native coronary artery without angina pectoris: Secondary | ICD-10-CM | POA: Diagnosis not present

## 2018-11-28 DIAGNOSIS — H35073 Retinal telangiectasis, bilateral: Secondary | ICD-10-CM | POA: Diagnosis not present

## 2018-11-28 DIAGNOSIS — H04123 Dry eye syndrome of bilateral lacrimal glands: Secondary | ICD-10-CM | POA: Diagnosis not present

## 2018-11-28 DIAGNOSIS — Z961 Presence of intraocular lens: Secondary | ICD-10-CM | POA: Diagnosis not present

## 2018-11-28 DIAGNOSIS — H35371 Puckering of macula, right eye: Secondary | ICD-10-CM | POA: Diagnosis not present

## 2018-12-29 ENCOUNTER — Telehealth: Payer: Self-pay | Admitting: *Deleted

## 2018-12-29 NOTE — Telephone Encounter (Signed)
Spoke with patient's wife to obtain verbal consent to have medication delivered via [employer] option.  Consent was verbal and  "yes."  Patient has PO box so obtained physical address to deliver to home.  Instructed not to take medication until follow up phone call to verify medication received acceptable for use. Patient came to phone and visit T6-M12 conducted by phone.  No cos, aes or saes to report to sponsor. Pt verbalized understanding that a F/u phone call would occur.

## 2019-01-26 DIAGNOSIS — I10 Essential (primary) hypertension: Secondary | ICD-10-CM | POA: Diagnosis not present

## 2019-01-26 DIAGNOSIS — G4733 Obstructive sleep apnea (adult) (pediatric): Secondary | ICD-10-CM | POA: Diagnosis not present

## 2019-01-26 DIAGNOSIS — M545 Low back pain: Secondary | ICD-10-CM | POA: Diagnosis not present

## 2019-01-26 DIAGNOSIS — I2510 Atherosclerotic heart disease of native coronary artery without angina pectoris: Secondary | ICD-10-CM | POA: Diagnosis not present

## 2019-02-21 DIAGNOSIS — D0422 Carcinoma in situ of skin of left ear and external auricular canal: Secondary | ICD-10-CM | POA: Diagnosis not present

## 2019-02-21 DIAGNOSIS — L570 Actinic keratosis: Secondary | ICD-10-CM | POA: Diagnosis not present

## 2019-02-21 DIAGNOSIS — X32XXXD Exposure to sunlight, subsequent encounter: Secondary | ICD-10-CM | POA: Diagnosis not present

## 2019-02-22 ENCOUNTER — Encounter: Payer: Self-pay | Admitting: Adult Health

## 2019-02-23 ENCOUNTER — Telehealth: Payer: Self-pay | Admitting: *Deleted

## 2019-02-23 NOTE — Telephone Encounter (Signed)
`  I called pt. Due to current COVID 19 pandemic, our office is severely reducing in office visits until further notice, in order to minimize the risk to our patients and healthcare providers.  He will come in to office. Explained process. He verbalized understanding.

## 2019-02-28 ENCOUNTER — Ambulatory Visit (INDEPENDENT_AMBULATORY_CARE_PROVIDER_SITE_OTHER): Payer: Medicare Other | Admitting: Adult Health

## 2019-02-28 ENCOUNTER — Encounter: Payer: Self-pay | Admitting: Adult Health

## 2019-02-28 ENCOUNTER — Other Ambulatory Visit: Payer: Self-pay

## 2019-02-28 VITALS — BP 140/73 | HR 56 | Temp 96.9°F | Ht 70.0 in | Wt 178.8 lb

## 2019-02-28 DIAGNOSIS — G4733 Obstructive sleep apnea (adult) (pediatric): Secondary | ICD-10-CM

## 2019-02-28 DIAGNOSIS — Z9989 Dependence on other enabling machines and devices: Secondary | ICD-10-CM

## 2019-02-28 NOTE — Progress Notes (Signed)
CMM orders sent and received from [place]. sy

## 2019-02-28 NOTE — Patient Instructions (Signed)
Your Plan:  Continue using CPAP nightly If your symptoms worsen or you develop new symptoms please let us know.   Thank you for coming to see us at [facility]. I hope we have been able to provide you high quality care today.  You may receive a patient satisfaction survey over the next few weeks. We would appreciate your feedback and comments so that we may continue to improve ourselves and the health of our patients.  
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
## 2019-03-21 ENCOUNTER — Telehealth: Payer: Self-pay

## 2019-03-21 DIAGNOSIS — Z85828 Personal history of other malignant neoplasm of skin: Secondary | ICD-10-CM | POA: Diagnosis not present

## 2019-03-21 DIAGNOSIS — Z08 Encounter for follow-up examination after completed treatment for malignant neoplasm: Secondary | ICD-10-CM | POA: Diagnosis not present

## 2019-03-21 DIAGNOSIS — D485 Neoplasm of uncertain behavior of skin: Secondary | ICD-10-CM | POA: Diagnosis not present

## 2019-03-21 DIAGNOSIS — D225 Melanocytic nevi of trunk: Secondary | ICD-10-CM | POA: Diagnosis not present

## 2019-03-21 DIAGNOSIS — Z1283 Encounter for screening for malignant neoplasm of skin: Secondary | ICD-10-CM | POA: Diagnosis not present

## 2019-03-21 NOTE — Telephone Encounter (Signed)
Sent another CMM about cpap supply order in epic to [facility] (christina soldana and jessica brown).

## 2019-03-21 NOTE — Telephone Encounter (Signed)
Pt call needing cipap supplies. His DME company is [employer].The company has not receive the form. It was fax on 02/23/2019 and it was sent to [facility]. They need a order every year.

## 2019-03-22 NOTE — Telephone Encounter (Signed)
I have Caryl Pina reaching out to patient today. I will be sending over a DWO for his yearly supplies as well. That was part of the problem for our resupply center per Kristopher Oppenheim at [facility].

## 2019-03-28 DIAGNOSIS — K5900 Constipation, unspecified: Secondary | ICD-10-CM | POA: Diagnosis not present

## 2019-03-28 DIAGNOSIS — I10 Essential (primary) hypertension: Secondary | ICD-10-CM | POA: Diagnosis not present

## 2019-03-28 DIAGNOSIS — M545 Low back pain: Secondary | ICD-10-CM | POA: Diagnosis not present

## 2019-03-28 DIAGNOSIS — J45909 Unspecified asthma, uncomplicated: Secondary | ICD-10-CM | POA: Diagnosis not present

## 2019-03-28 DIAGNOSIS — I2510 Atherosclerotic heart disease of native coronary artery without angina pectoris: Secondary | ICD-10-CM | POA: Diagnosis not present

## 2019-03-28 DIAGNOSIS — G4733 Obstructive sleep apnea (adult) (pediatric): Secondary | ICD-10-CM | POA: Diagnosis not present

## 2019-03-28 DIAGNOSIS — M4302 Spondylolysis, cervical region: Secondary | ICD-10-CM | POA: Diagnosis not present

## 2019-03-31 DIAGNOSIS — D485 Neoplasm of uncertain behavior of skin: Secondary | ICD-10-CM | POA: Diagnosis not present

## 2019-03-31 DIAGNOSIS — L988 Other specified disorders of the skin and subcutaneous tissue: Secondary | ICD-10-CM | POA: Diagnosis not present

## 2019-04-18 ENCOUNTER — Telehealth: Payer: Self-pay | Admitting: *Deleted

## 2019-04-18 DIAGNOSIS — H5203 Hypermetropia, bilateral: Secondary | ICD-10-CM | POA: Diagnosis not present

## 2019-04-18 DIAGNOSIS — H353111 Nonexudative age-related macular degeneration, right eye, early dry stage: Secondary | ICD-10-CM | POA: Diagnosis not present

## 2019-04-18 DIAGNOSIS — H04123 Dry eye syndrome of bilateral lacrimal glands: Secondary | ICD-10-CM | POA: Diagnosis not present

## 2019-04-18 NOTE — Telephone Encounter (Signed)
Spoke with patient for visit T7-M15 in the clear research study.  No cos, aes or saes to report.  Scheduled visit for lab draw and vitals.

## 2019-04-24 ENCOUNTER — Encounter: Payer: Medicare Other | Admitting: *Deleted

## 2019-04-24 ENCOUNTER — Other Ambulatory Visit: Payer: Self-pay

## 2019-04-24 VITALS — BP 131/65 | HR 65 | Wt 178.2 lb

## 2019-04-24 DIAGNOSIS — Z006 Encounter for examination for normal comparison and control in clinical research program: Secondary | ICD-10-CM

## 2019-04-24 NOTE — Research (Signed)
Patient to research clinic for unscheduled visit in the clear research study.  Re-consented.  Clear Informed Consent   Subject Name: Tyler Reid  Subject met inclusion and exclusion criteria.  The informed consent form, study requirements and expectations were reviewed with the subject and questions and concerns were addressed prior to the signing of the consent form.  The subject verbalized understanding of the trial requirements.  The subject agreed to participate in the Clear trial and signed the informed consent  on 04/24/2019.  The informed consent was obtained prior to performance of any protocol-specific procedures for the subject.  A copy of the signed informed consent was given to the subject and a copy was placed in the subject's medical record.   Oletta Cohn.
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
## 2019-05-02 ENCOUNTER — Encounter: Payer: Self-pay | Admitting: Physician Assistant

## 2019-05-02 ENCOUNTER — Other Ambulatory Visit: Payer: Self-pay

## 2019-05-02 ENCOUNTER — Ambulatory Visit (INDEPENDENT_AMBULATORY_CARE_PROVIDER_SITE_OTHER): Payer: Medicare Other | Admitting: Physician Assistant

## 2019-05-02 VITALS — BP 120/64 | HR 50 | Ht 70.0 in | Wt 178.6 lb

## 2019-05-02 DIAGNOSIS — E782 Mixed hyperlipidemia: Secondary | ICD-10-CM

## 2019-05-02 DIAGNOSIS — I119 Hypertensive heart disease without heart failure: Secondary | ICD-10-CM

## 2019-05-02 DIAGNOSIS — I2510 Atherosclerotic heart disease of native coronary artery without angina pectoris: Secondary | ICD-10-CM | POA: Diagnosis not present

## 2019-05-02 DIAGNOSIS — I10 Essential (primary) hypertension: Secondary | ICD-10-CM

## 2019-05-02 NOTE — Patient Instructions (Signed)
Medication Instructions:  No changes.  Continue current medications.  If you need a refill on your cardiac medications before your next appointment, please call your pharmacy.   Lab work: None If you have labs (blood work) drawn today and your tests are completely normal, you will receive your results only by: Marland Kitchen MyChart Message (if you have MyChart) OR . A paper copy in the mail If you have any lab test that is abnormal or we need to change your treatment, we will call you to review the results.  Testing/Procedures: None  Follow-Up: At [facility], you and your health needs are our priority.  As part of our continuing mission to provide you with exceptional heart care, we have created designated Provider Care Teams.  These Care Teams include your primary Cardiologist (physician) and Advanced Practice Providers (APPs -  Physician Assistants and Nurse Practitioners) who all work together to provide you with the care you need, when you need it. . Dr. Acie Fredrickson in 1 year  Any Other Special Instructions Will Be Listed Below (If Applicable).

## 2019-06-15 DIAGNOSIS — Z23 Encounter for immunization: Secondary | ICD-10-CM | POA: Diagnosis not present

## 2019-07-06 ENCOUNTER — Encounter: Payer: Medicare Other | Admitting: *Deleted

## 2019-07-06 ENCOUNTER — Other Ambulatory Visit: Payer: Self-pay

## 2019-07-06 VITALS — BP 126/68 | HR 54 | Wt 179.6 lb

## 2019-07-06 DIAGNOSIS — Z006 Encounter for examination for normal comparison and control in clinical research program: Secondary | ICD-10-CM

## 2019-07-06 NOTE — Research (Signed)
Patient to research clinic for visit [id number] in the clear research study.  No cos, aes or saes to report.  New drug dispensed, next phone call and clinic visit scheduled.  Previously re-consented

## 2019-07-11 DIAGNOSIS — E782 Mixed hyperlipidemia: Secondary | ICD-10-CM | POA: Diagnosis not present

## 2019-07-11 DIAGNOSIS — Z125 Encounter for screening for malignant neoplasm of prostate: Secondary | ICD-10-CM | POA: Diagnosis not present

## 2019-07-11 DIAGNOSIS — E291 Testicular hypofunction: Secondary | ICD-10-CM | POA: Diagnosis not present

## 2019-07-18 DIAGNOSIS — R82998 Other abnormal findings in urine: Secondary | ICD-10-CM | POA: Diagnosis not present

## 2019-07-18 DIAGNOSIS — Z1212 Encounter for screening for malignant neoplasm of rectum: Secondary | ICD-10-CM | POA: Diagnosis not present

## 2019-07-18 DIAGNOSIS — I10 Essential (primary) hypertension: Secondary | ICD-10-CM | POA: Diagnosis not present

## 2019-07-26 DIAGNOSIS — J300 Vasomotor rhinitis: Secondary | ICD-10-CM | POA: Diagnosis not present

## 2019-07-26 DIAGNOSIS — J3089 Other allergic rhinitis: Secondary | ICD-10-CM | POA: Diagnosis not present

## 2019-07-26 DIAGNOSIS — K219 Gastro-esophageal reflux disease without esophagitis: Secondary | ICD-10-CM | POA: Diagnosis not present

## 2019-09-04 ENCOUNTER — Telehealth: Payer: Self-pay | Admitting: Pharmacist

## 2019-09-04 NOTE — Telephone Encounter (Addendum)
Pt called clinic to discuss his cholesterol. He is in the CLEAR trial studying bempedoic acid but states his PCP checked his cholesterol and his numbers are still high. LDL was 126 last month at his PCP office. His PCP mentioned PCSK9i therapy and pt wishes to start either Repatha or Praluent.  He first tried statin therapy 15 years ago and has not been able to tolerate any of them secondary to myalgias. Over the years, he has tried rosuvastatin, atorvastatin 10mg  and 20mg  daily, simvastatin, pravastatin, and Zetia - he experienced muscle aches and weakness with all.  Will submit prior authorization for Repatha and follow up with pt after. He has Pharmacist, community and will qualify for a copay card.

## 2019-09-12 NOTE — Telephone Encounter (Addendum)
Prior auth denied - insurance did not qualify elevated calcium score as ASCVD. Called [employer] FEP and was able to submit new authorization under primary prevention indication since he does not meet their definition of secondary prevention. Patient's 10 year ASCVD risk is 26.1% using the 10 year risk pooled cohort - maximum lipid lowering therapy is recommended. New authorization has been submitted for Repatha coverage.

## 2019-09-18 NOTE — Telephone Encounter (Signed)
Patient returning call. He states his Repatha was denied and would like a call back about what to do.

## 2019-09-18 NOTE — Telephone Encounter (Signed)
Returned call to pt - denial letter is from 12/28. New authorization was submitted on 12/29 - we are still awaiting response from his insurance. Pt is aware.

## 2019-09-19 MED ORDER — REPATHA SURECLICK 140 MG/ML ~~LOC~~ SOAJ: 1.0000 "pen " | SUBCUTANEOUS | 11 refills | Status: DC

## 2019-09-19 NOTE — Addendum Note (Signed)
Addended by: Gionna Polak E on: 09/19/2019 09:58 AM   Modules accepted: Orders

## 2019-09-19 NOTE — Telephone Encounter (Signed)
Called insurance who stated they approved prior auth for [place]. Asked them to fax [place] a copy of approval since we never received one.  Attempted to activate $5 copay card, unable to do so even though pt has commercial insurance since he is over 65. He will need to call to confirm info and activate copay card himself [phone]. Pt is aware and will call for copay card.  Advised him to call clinic if he wishes to bring his first injection to the office for training. Will also need to coordinate lab work once he starts on injections. He is aware to call once he begins injections to get this scheduled.

## 2019-09-29 ENCOUNTER — Other Ambulatory Visit: Payer: Self-pay | Admitting: Pharmacist

## 2019-09-29 DIAGNOSIS — Z1283 Encounter for screening for malignant neoplasm of skin: Secondary | ICD-10-CM | POA: Diagnosis not present

## 2019-09-29 DIAGNOSIS — L648 Other androgenic alopecia: Secondary | ICD-10-CM | POA: Diagnosis not present

## 2019-09-29 DIAGNOSIS — L821 Other seborrheic keratosis: Secondary | ICD-10-CM | POA: Diagnosis not present

## 2019-09-29 MED ORDER — REPATHA SURECLICK 140 MG/ML ~~LOC~~ SOAJ: 1.0000 "pen " | SUBCUTANEOUS | 3 refills | Status: DC

## 2019-10-17 ENCOUNTER — Telehealth: Payer: Self-pay | Admitting: Cardiovascular Disease

## 2019-10-17 ENCOUNTER — Telehealth: Payer: Self-pay | Admitting: *Deleted

## 2019-10-17 NOTE — Telephone Encounter (Signed)
[place] [facility] is needing diagnosis codes, allergies, and medication lists for this patient. When calling [facility] please be sure to ask to speak with a pharmacist.

## 2019-10-17 NOTE — Telephone Encounter (Signed)
Phone call/chart check for visit [id number] in the Clear Research study.  No aes or saes to report.  Subject has started Repatha injections.

## 2019-10-19 NOTE — Telephone Encounter (Signed)
Returned call to Nevin Bloodgood, Software engineer at [employer] who is requesting allergy information and ICD 10 diagnosis codes for patient's Repatha Rx. I answered all questions and Nevin Bloodgood thanked me for the call.

## 2019-10-24 DIAGNOSIS — I2510 Atherosclerotic heart disease of native coronary artery without angina pectoris: Secondary | ICD-10-CM | POA: Diagnosis not present

## 2019-10-24 DIAGNOSIS — G4733 Obstructive sleep apnea (adult) (pediatric): Secondary | ICD-10-CM | POA: Diagnosis not present

## 2019-10-24 DIAGNOSIS — E782 Mixed hyperlipidemia: Secondary | ICD-10-CM | POA: Diagnosis not present

## 2019-10-24 DIAGNOSIS — Z1331 Encounter for screening for depression: Secondary | ICD-10-CM | POA: Diagnosis not present

## 2019-10-24 DIAGNOSIS — I10 Essential (primary) hypertension: Secondary | ICD-10-CM | POA: Diagnosis not present

## 2019-11-15 DIAGNOSIS — R030 Elevated blood-pressure reading, without diagnosis of hypertension: Secondary | ICD-10-CM | POA: Diagnosis not present

## 2019-11-21 DIAGNOSIS — R030 Elevated blood-pressure reading, without diagnosis of hypertension: Secondary | ICD-10-CM | POA: Diagnosis not present

## 2019-12-05 ENCOUNTER — Telehealth: Payer: Self-pay | Admitting: Gastroenterology

## 2019-12-05 ENCOUNTER — Other Ambulatory Visit: Payer: Self-pay

## 2019-12-05 NOTE — Telephone Encounter (Signed)
Called and spoke with patient-patient reports he is having increased gas about 5-10 mintues after eating anything, has no odor;  -"My stool is so sticky and and I can't get myself clean"-changes in bowel movements (happening for about a year now)-PCP advised patient to call GI office;   -"I am taking Magnesium Citrate and a vitamin pill 500 mg that is working fairly well for the constipation";  -advised to use Tucks wipes, baby wipes, Desitin, Prep-H wipes or cream for comfort, increase water intake, eat fresh fruits/veggies;   Please advise on further instructions

## 2019-12-05 NOTE — Telephone Encounter (Signed)
This patient was last seen in this office 4 years ago.  Please make an appointment to see me or one of our APPs

## 2019-12-05 NOTE — Telephone Encounter (Signed)
Left message for patient to call back to the office;  

## 2019-12-06 ENCOUNTER — Encounter: Payer: Self-pay | Admitting: Gastroenterology

## 2019-12-06 NOTE — Telephone Encounter (Signed)
Consult scheduled on 01/08/20 at 3:20pm.

## 2019-12-15 DIAGNOSIS — Z23 Encounter for immunization: Secondary | ICD-10-CM | POA: Diagnosis not present

## 2020-01-02 ENCOUNTER — Encounter: Payer: Medicare Other | Admitting: *Deleted

## 2020-01-02 ENCOUNTER — Other Ambulatory Visit: Payer: Self-pay

## 2020-01-02 VITALS — BP 128/64 | HR 48 | Wt 175.8 lb

## 2020-01-02 DIAGNOSIS — Z006 Encounter for examination for normal comparison and control in clinical research program: Secondary | ICD-10-CM

## 2020-01-02 NOTE — Research (Signed)
Patient to research clinic for visit [phone] in the clear research study.  No aes or saes to report.  Next clinic visit scheduled.

## 2020-01-08 ENCOUNTER — Ambulatory Visit (INDEPENDENT_AMBULATORY_CARE_PROVIDER_SITE_OTHER): Payer: Medicare Other | Admitting: Gastroenterology

## 2020-01-08 ENCOUNTER — Encounter: Payer: Self-pay | Admitting: Gastroenterology

## 2020-01-08 VITALS — BP 116/64 | HR 58 | Temp 97.5°F | Ht 70.0 in | Wt 175.4 lb

## 2020-01-08 DIAGNOSIS — R194 Change in bowel habit: Secondary | ICD-10-CM

## 2020-01-08 NOTE — Patient Instructions (Addendum)
If you are age 75 or older, your body mass index should be between 23-30. Your Body mass index is 25.17 kg/m. If this is out of the aforementioned range listed, please consider follow up with your Primary Care Provider.  If you are age 75 or younger, your body mass index should be between 19-25. Your Body mass index is 25.17 kg/m. If this is out of the aformentioned range listed, please consider follow up with your Primary Care Provider.   Stop Co-Q10 for a few days, if no improvement in bowel habits decrease the magnesium from twice a day to once a day.  Start the fiber supplement daily.     Food Guidelines for those with chronic digestive trouble:  Many people have difficulty digesting certain foods, causing a variety of distressing and embarrassing symptoms such as abdominal pain, bloating and gas.  These foods may need to be avoided or consumed in small amounts.  Here are some tips that might be helpful for you.  1.   Lactose intolerance is the difficulty or complete inability to digest lactose, the natural sugar in milk and anything made from milk.  This condition is harmless, common, and can begin any time during life.  Some people can digest a modest amount of lactose while others cannot tolerate any.  Also, not all dairy products contain equal amounts of lactose.  For example, hard cheeses such as parmesan have less lactose than soft cheeses such as cheddar.  Yogurt has less lactose than milk or cheese.  Many packaged foods (even many brands of bread) have milk, so read ingredient lists carefully.  It is difficult to test for lactose intolerance, so just try avoiding lactose as much as possible for a week and see what happens with your symptoms.  If you seem to be lactose intolerant, the best plan is to avoid it (but make sure you get calcium from another source).  The next best thing is to use lactase enzyme supplements, available over the counter everywhere.  Just know that many lactose  intolerant people need to take several tablets with each serving of dairy to avoid symptoms.  Lastly, a lot of restaurant food is made with milk or butter.  Many are things you might not suspect, such as mashed potatoes, rice and pasta (cooked with butter) and "grilled" items.  If you are lactose intolerant, it never hurts to ask your server what has milk or butter.  2.   Fiber is an important part of your diet, but not all fiber is well-tolerated.  Insoluble fiber such as bran is often consumed by normal gut bacteria and converted into gas.  Soluble fiber such as oats, squash, carrots and green beans are typically tolerated better.  3.   Some types of carbohydrates can be poorly digested.  Examples include: fructose (apples, cherries, pears, raisins and other dried fruits), fructans (onions, zucchini, large amounts of wheat), sorbitol/mannitol/xylitol and sucralose/Splenda (common artificial sweeteners), and raffinose (lentils, broccoli, cabbage, asparagus, brussel sprouts, many types of beans).  Do a Development worker, community for [employer] and you will find helpful information. Beano, a dietary supplement, will often help with raffinose-containing foods.  As with lactase tablets, you may need several per serving.  4.   Whenever possible, avoid processed food&meats and chemical additives.  High fructose corn syrup, a common sweetener, may be difficult to digest.  Eggs and soy (comes from the soybean, and added to many foods now) are other common bloating/gassy foods.  5.  Regarding gluten:  gluten is a protein mainly found in wheat, but also rye and barley.  There is a condition called celiac sprue, which is an inflammatory reaction in the small intestine causing a variety of digestive symptoms.  Blood testing is highly reliable to look for this condition, and sometimes upper endoscopy with small bowel biopsies may be necessary to make the diagnosis.  Many patients who test negative for celiac sprue report improvement  in their digestive symptoms when they switch to a gluten-free diet.  However, in these "non-celiac gluten sensitive" patients, the true role of gluten in their symptoms is unclear.  Reducing carbohydrates in general may decrease the gas and bloating caused when gut bacteria consume carbs. Also, some of these patients may actually be intolerant of the baker's yeast in bread products rather than the gluten.  Flatbread and other reduced yeast breads might therefore be tolerated.  There is no specific testing available for most food intolerances, which are discovered mainly by dietary elimination.  Please do not embark on a gluten free diet unless directed by your doctor, as it is highly restrictive, and may lead to nutritional deficiencies if not carefully monitored.  Lastly, beware of internet claims offering "personalized" tests for food intolerances.  Such testing has no reliable scientific evidence to support its reliability and correlation to symptoms.    6.  The best advice is old advice, especially for those with chronic digestive trouble - try to eat "clean".  Balanced diet, avoid processed food, plenty of fruits and vegetables, cut down the sugar, minimal alcohol, avoid tobacco. Make time to care for yourself, get enough sleep, exercise when you can, reduce stress.  Your guts will thank you for it.   - Dr. Herma [facility] ____________________________________________________________
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
## 2020-03-04 ENCOUNTER — Other Ambulatory Visit: Payer: Self-pay

## 2020-03-04 ENCOUNTER — Encounter: Payer: Self-pay | Admitting: Adult Health

## 2020-03-04 ENCOUNTER — Ambulatory Visit (INDEPENDENT_AMBULATORY_CARE_PROVIDER_SITE_OTHER): Payer: Medicare Other | Admitting: Adult Health

## 2020-03-04 VITALS — BP 130/75 | HR 54 | Ht 70.0 in | Wt 172.0 lb

## 2020-03-04 DIAGNOSIS — Z9989 Dependence on other enabling machines and devices: Secondary | ICD-10-CM

## 2020-03-04 DIAGNOSIS — R55 Syncope and collapse: Secondary | ICD-10-CM | POA: Diagnosis not present

## 2020-03-04 DIAGNOSIS — G4733 Obstructive sleep apnea (adult) (pediatric): Secondary | ICD-10-CM
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
## 2020-03-28 ENCOUNTER — Telehealth: Payer: Self-pay | Admitting: *Deleted

## 2020-03-28 NOTE — Telephone Encounter (Signed)
Completed call/chart check for CLEAR visit T11,M27. All medications updated. No AE's/SAE's to report. Next appointment confirmed.

## 2020-04-02 ENCOUNTER — Telehealth: Payer: Self-pay | Admitting: Adult Health

## 2020-04-02 NOTE — Telephone Encounter (Signed)
Sent community message to [employer] re: to pt coming in office and not receiving supplies.

## 2020-04-02 NOTE — Telephone Encounter (Signed)
I called pt and LMVM for him that received message back from christina in [employer] and she will look into this for him.  I left # for him to call if he wanted.

## 2020-04-02 NOTE — Telephone Encounter (Signed)
Patient walked into the lobby today stating he has not received his CPAP supplies. The vender received the fax from [place] but it was not signed or dated. Best call back for the patient is [phone]

## 2020-04-02 NOTE — Telephone Encounter (Signed)
Towanda Octave, RN Thank you! I will check into this and see what the hold up is.   Margreta Journey

## 2020-04-04 ENCOUNTER — Other Ambulatory Visit: Payer: Self-pay | Admitting: *Deleted

## 2020-04-04 DIAGNOSIS — Z9989 Dependence on other enabling machines and devices: Secondary | ICD-10-CM

## 2020-04-04 NOTE — Progress Notes (Signed)
RE: new cpap supplies Received: Lawerance Bach, Dola Factor, RN Lyn Hollingshead!   I don't see where an RX was ever put in. I have pulled the chart notes and will send a DWO via [facility] for Dr. Rexene Alberts.   We will be calling him in the morning. :)  I placed order for cpap supplies and is to be cosigned by MM/NP.

## 2020-04-28 ENCOUNTER — Encounter: Payer: Self-pay | Admitting: Neurology

## 2020-04-29 ENCOUNTER — Telehealth: Payer: Self-pay | Admitting: Adult Health

## 2020-04-29 DIAGNOSIS — G4733 Obstructive sleep apnea (adult) (pediatric): Secondary | ICD-10-CM

## 2020-04-29 NOTE — Telephone Encounter (Signed)
I called pt and he is asking for the the pressure settings to be made automatic like you all spoke in the officeat his appt.  I printed download,  (he has cental apneas).

## 2020-04-29 NOTE — Telephone Encounter (Signed)
Pt called and LVM stating that he would like to make the changes on his Cpap machine that were discussed in his last appt. Please advise.

## 2020-04-29 NOTE — Telephone Encounter (Signed)
Spoke to pt, tried to relay that his most recent cpap report from day back 30 days is excellent.  He is concerned with the central apnea #, wants to bring those down has much has he can so changing the pressure (automatically) he wants to try.

## 2020-04-29 NOTE — Telephone Encounter (Signed)
His CPAP download shows that his apnea is well treated.  I am not sure that he needs to switch to auto at this time?  Of course if his numbers went up we could consider it however if he feels that it would be more comfortable to be on auto I do not mind making the change

## 2020-04-30 NOTE — Telephone Encounter (Signed)
I relayed to pt that did send message to [employer] re: new autoset pressure 5-12cm/h20. He will let us know if need to do anything more.

## 2020-04-30 NOTE — Addendum Note (Signed)
Addended by: Trudie Buckler on: 04/30/2020 11:08 AM   Modules accepted: Orders

## 2020-04-30 NOTE — Telephone Encounter (Signed)
We will change to AutoSet 5 to 12 cm of water order placed

## 2020-04-30 NOTE — Telephone Encounter (Signed)
Message sent to [employer] with new autoset orders. Pt made aware.

## 2020-05-01 NOTE — Telephone Encounter (Signed)
RE: cpap Received: Today [name], RN Thank you, will process.   Tyler Reid

## 2020-05-06 ENCOUNTER — Encounter: Payer: Self-pay | Admitting: Cardiovascular Disease

## 2020-05-06 ENCOUNTER — Other Ambulatory Visit: Payer: Self-pay

## 2020-05-06 ENCOUNTER — Ambulatory Visit (INDEPENDENT_AMBULATORY_CARE_PROVIDER_SITE_OTHER): Payer: Medicare Other | Admitting: Cardiovascular Disease

## 2020-05-06 VITALS — BP 108/60 | HR 52 | Ht 70.0 in | Wt 170.8 lb

## 2020-05-06 DIAGNOSIS — I2510 Atherosclerotic heart disease of native coronary artery without angina pectoris: Secondary | ICD-10-CM | POA: Diagnosis not present
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
## 2020-05-06 NOTE — Patient Instructions (Signed)
Medication Instructions:  Your physician recommends that you continue on your current medications as directed. Please refer to the Current Medication list given to you today.  *If you need a refill on your cardiac medications before your next appointment, please call your pharmacy*   Lab Work: None  If you have labs (blood work) drawn today and your tests are completely normal, you will receive your results only by: Marland Kitchen MyChart Message (if you have MyChart) OR . A paper copy in the mail If you have any lab test that is abnormal or we need to change your treatment, we will call you to review the results.   Testing/Procedures: None   Follow-Up: At [facility], you and your health needs are our priority.  As part of our continuing mission to provide you with exceptional heart care, we have created designated Provider Care Teams.  These Care Teams include your primary Cardiologist (physician) and Advanced Practice Providers (APPs -  Physician Assistants and Nurse Practitioners) who all work together to provide you with the care you need, when you need it.  We recommend signing up for the patient portal called "MyChart".  Sign up information is provided on this After Visit Summary.  MyChart is used to connect with patients for Virtual Visits (Telemedicine).  Patients are able to view lab/test results, encounter notes, upcoming appointments, etc.  Non-urgent messages can be sent to your provider as well.   To learn more about what you can do with MyChart, go to [email or web address].    Your next appointment:   12 month(s)  The format for your next appointment:   In Person  Provider:   You may see Mertie Moores, MD or one of the following Advanced Practice Providers on your designated Care Team:    Richardson Dopp, PA-C  Robbie Lis, [place]    Other Instructions

## 2020-06-11 DIAGNOSIS — L718 Other rosacea: Secondary | ICD-10-CM | POA: Diagnosis not present

## 2020-06-11 DIAGNOSIS — C44311 Basal cell carcinoma of skin of nose: Secondary | ICD-10-CM | POA: Diagnosis not present

## 2020-06-26 ENCOUNTER — Encounter: Payer: Medicare Other | Admitting: *Deleted

## 2020-06-26 ENCOUNTER — Other Ambulatory Visit: Payer: Self-pay

## 2020-06-26 VITALS — BP 107/66 | HR 50 | Wt 172.2 lb

## 2020-06-26 DIAGNOSIS — Z006 Encounter for examination for normal comparison and control in clinical research program: Secondary | ICD-10-CM

## 2020-06-26 NOTE — Research (Signed)
Subject came to clinic for T12, M30 visit for the CLEAR research study.   Subject was re-consented to [place] Version 8.1 [id number] Local [id number] on 06/26/2020 at 0806am.    Subject Name: Tyler Reid [facility]  Subject met inclusion and exclusion criteria.  The informed consent form, study requirements and expectations were reviewed with the subject and questions and concerns were addressed prior to the signing of the consent form.  The subject verbalized understanding of the trial requirements.  The subject agreed to participate in the CLEAR trial and signed the informed consent at 0806 on 06/26/20  The informed consent was obtained prior to performance of any protocol-specific procedures for the subject.  A copy of the signed informed consent was given to the subject and a copy was placed in the subject's medical record.   Preston Fleeting C    Subject's concomitant medications were reviewed and updated. There are no AE's or SAE's to report to sponsor at this time. Subject inadvertently forgot to bring back medication bottles and stated that he will bring them back at the next appointment or earlier if he is close by. New IP was dispensed. Next appointment is scheduled for Wednesday, April 13th, 2021 at 8:00am.

## 2020-06-28 ENCOUNTER — Encounter: Payer: Medicare Other | Admitting: *Deleted

## 2020-06-28 ENCOUNTER — Other Ambulatory Visit: Payer: Self-pay

## 2020-06-28 DIAGNOSIS — Z006 Encounter for examination for normal comparison and control in clinical research program: Secondary | ICD-10-CM

## 2020-06-28 NOTE — Addendum Note (Signed)
Addended by: Preston Fleeting C on: 06/28/2020 11:25 AM   Modules accepted: Orders

## 2020-06-28 NOTE — Research (Signed)
Subject came into lab today for an unscheduled visit for the CLEAR research study for labs.

## 2020-06-30 LAB — BASIC METABOLIC PANEL
BUN/Creatinine Ratio: 18 (ref 10–24)
BUN: 20 mg/dL (ref 8–27)
CO2: 24 mmol/L (ref 20–29)
Calcium: 9.6 mg/dL (ref 8.6–10.2)
Chloride: 96 mmol/L (ref 96–106)
Creatinine, Ser: 1.14 mg/dL (ref 0.76–1.27)
GFR calc Af Amer: 72 mL/min/{1.73_m2} (ref >59)
GFR calc non Af Amer: 63 mL/min/{1.73_m2} (ref >59)
Glucose: 90 mg/dL (ref 65–99)
Potassium: 3.9 mmol/L (ref 3.5–5.2)
Sodium: 137 mmol/L (ref 134–144)

## 2020-07-01 ENCOUNTER — Encounter: Payer: Self-pay | Admitting: *Deleted

## 2020-07-19 DIAGNOSIS — Z23 Encounter for immunization: Secondary | ICD-10-CM | POA: Diagnosis not present

## 2020-07-19 DIAGNOSIS — E782 Mixed hyperlipidemia: Secondary | ICD-10-CM | POA: Diagnosis not present

## 2020-07-19 DIAGNOSIS — Z125 Encounter for screening for malignant neoplasm of prostate: Secondary | ICD-10-CM | POA: Diagnosis not present

## 2020-07-23 DIAGNOSIS — Z85828 Personal history of other malignant neoplasm of skin: Secondary | ICD-10-CM | POA: Diagnosis not present

## 2020-07-23 DIAGNOSIS — L648 Other androgenic alopecia: Secondary | ICD-10-CM | POA: Diagnosis not present

## 2020-07-26 DIAGNOSIS — F329 Major depressive disorder, single episode, unspecified: Secondary | ICD-10-CM | POA: Diagnosis not present

## 2020-07-26 DIAGNOSIS — Z0000 Encounter for general adult medical examination without abnormal findings: Secondary | ICD-10-CM | POA: Diagnosis not present

## 2020-07-26 DIAGNOSIS — I2510 Atherosclerotic heart disease of native coronary artery without angina pectoris: Secondary | ICD-10-CM | POA: Diagnosis not present

## 2020-07-26 DIAGNOSIS — J45909 Unspecified asthma, uncomplicated: Secondary | ICD-10-CM | POA: Diagnosis not present

## 2020-07-26 DIAGNOSIS — I10 Essential (primary) hypertension: Secondary | ICD-10-CM | POA: Diagnosis not present

## 2020-07-26 DIAGNOSIS — G2581 Restless legs syndrome: Secondary | ICD-10-CM | POA: Diagnosis not present

## 2020-07-26 DIAGNOSIS — R82998 Other abnormal findings in urine: Secondary | ICD-10-CM | POA: Diagnosis not present

## 2020-07-26 DIAGNOSIS — E291 Testicular hypofunction: Secondary | ICD-10-CM | POA: Diagnosis not present

## 2020-07-26 DIAGNOSIS — E782 Mixed hyperlipidemia: Secondary | ICD-10-CM | POA: Diagnosis not present

## 2020-07-26 DIAGNOSIS — K219 Gastro-esophageal reflux disease without esophagitis: Secondary | ICD-10-CM | POA: Diagnosis not present

## 2020-07-26 DIAGNOSIS — Z23 Encounter for immunization: Secondary | ICD-10-CM | POA: Diagnosis not present

## 2020-08-01 DIAGNOSIS — K219 Gastro-esophageal reflux disease without esophagitis: Secondary | ICD-10-CM | POA: Diagnosis not present

## 2020-08-01 DIAGNOSIS — J3089 Other allergic rhinitis: Secondary | ICD-10-CM | POA: Diagnosis not present

## 2020-08-01 DIAGNOSIS — J300 Vasomotor rhinitis: Secondary | ICD-10-CM | POA: Diagnosis not present

## 2020-08-21 DIAGNOSIS — Z1212 Encounter for screening for malignant neoplasm of rectum: Secondary | ICD-10-CM | POA: Diagnosis not present

## 2020-08-23 ENCOUNTER — Telehealth: Payer: Self-pay | Admitting: Cardiovascular Disease

## 2020-08-23 MED ORDER — REPATHA SURECLICK 140 MG/ML ~~LOC~~ SOAJ: 1.0000 "pen " | SUBCUTANEOUS | 3 refills | Status: DC

## 2020-08-23 NOTE — Telephone Encounter (Signed)
Returned call to pt. New refill has been sent to pharmacy, pt received information on how reactivate his copay card and I advised him to call Repatha Ready to do this so that he can continue receiving discounted rate. Also advised him that we have submitted prior authorization for the 2022 year and are waiting to hear back from his insurance.

## 2020-08-23 NOTE — Telephone Encounter (Signed)
Pt c/o medication issue:  1. Name of Medication: Evolocumab (REPATHA SURECLICK) 615 MG/ML SOAJ  2. How are you currently taking this medication (dosage and times per day)? Injection every 14 days   3. Are you having a reaction (difficulty breathing--STAT)? No   4. What is your medication issue? Tyler Reid is calling requesting to speak with [name] in regards to getting a new prescription and receiving further information about this medication for him to continue taking it. Please advise.

## 2020-09-04 NOTE — Telephone Encounter (Signed)
Patient called back and asked to speak with Tyler Reid about getting his medication. Please return his call

## 2020-09-04 NOTE — Telephone Encounter (Signed)
Pt called clinic, states he received a letter on 12/10 from his insurance that Repatha was denied because he did not show a 40% LDL reduction since starting therapy. LDL prior to Repatha initiation was 126 in April 2020. His PCP Dr Philip Aspen rechecked labs on 07/19/20 and his LDL dropped to 21 which is an 83% LDL reduction. Will make sure that his insurance has this information so that they renew [place] for the 2022 year.

## 2020-09-04 NOTE — Telephone Encounter (Addendum)
Called pt back, no answer, voicemail left.

## 2020-09-05 ENCOUNTER — Telehealth: Payer: Self-pay

## 2020-09-05 NOTE — Telephone Encounter (Signed)
Called and lmomed the pt stated that we needed fasting lipid labs to re apply for pa of repatha. Will await call back to order and get scheduled at [facility]st

## 2020-09-05 NOTE — Telephone Encounter (Signed)
lmomed pcp to call us back or fax [place] lipid labs

## 2020-09-05 NOTE — Telephone Encounter (Signed)
Pt called back to [facility] location. He does not need labs - see message I routed you yesterday in other phone note. He had labs drawn last month at PCP.

## 2020-09-05 NOTE — Telephone Encounter (Signed)
lmomed the pcp for labs

## 2020-09-24 ENCOUNTER — Encounter: Payer: Self-pay | Admitting: *Deleted

## 2020-09-24 NOTE — Progress Notes (Signed)
Completed phone call/chart review for CLEAR visit T13, M33. There are no new AE's or SAE's to report to sponsor at this time. All concomitant medications have been reviewed and updated if applicable in [facility]. Next appointment confirmed for April 13th, 2022 @ 0800.

## 2020-10-01 DIAGNOSIS — L308 Other specified dermatitis: Secondary | ICD-10-CM | POA: Diagnosis not present

## 2020-10-01 DIAGNOSIS — L570 Actinic keratosis: Secondary | ICD-10-CM | POA: Diagnosis not present

## 2020-10-01 DIAGNOSIS — X32XXXD Exposure to sunlight, subsequent encounter: Secondary | ICD-10-CM | POA: Diagnosis not present

## 2020-10-01 DIAGNOSIS — D225 Melanocytic nevi of trunk: Secondary | ICD-10-CM | POA: Diagnosis not present

## 2020-10-01 DIAGNOSIS — Z1283 Encounter for screening for malignant neoplasm of skin: Secondary | ICD-10-CM | POA: Diagnosis not present

## 2020-10-14 DIAGNOSIS — J349 Unspecified disorder of nose and nasal sinuses: Secondary | ICD-10-CM | POA: Diagnosis not present

## 2020-10-14 DIAGNOSIS — Z20828 Contact with and (suspected) exposure to other viral communicable diseases: Secondary | ICD-10-CM | POA: Diagnosis not present

## 2020-10-17 ENCOUNTER — Ambulatory Visit: Payer: Medicare Other | Admitting: Nurse Practitioner

## 2020-10-17 ENCOUNTER — Other Ambulatory Visit: Payer: Self-pay

## 2020-10-28 ENCOUNTER — Encounter: Payer: Self-pay | Admitting: Physician Assistant

## 2020-10-28 ENCOUNTER — Ambulatory Visit (INDEPENDENT_AMBULATORY_CARE_PROVIDER_SITE_OTHER): Payer: Medicare Other | Admitting: Physician Assistant

## 2020-10-28 VITALS — BP 132/70 | HR 69 | Ht 70.0 in | Wt 177.0 lb

## 2020-10-28 DIAGNOSIS — Z86010 Personal history of colonic polyps: Secondary | ICD-10-CM | POA: Diagnosis not present

## 2020-10-28 DIAGNOSIS — K5900 Constipation, unspecified: Secondary | ICD-10-CM | POA: Diagnosis not present

## 2020-10-28 DIAGNOSIS — R140 Abdominal distension (gaseous): Secondary | ICD-10-CM

## 2020-10-28 DIAGNOSIS — K5909 Other constipation: Secondary | ICD-10-CM | POA: Diagnosis not present

## 2020-10-28 MED ORDER — PLENVU 140 G PO SOLR: 1.0000 | ORAL | 0 refills | Status: DC

## 2020-10-28 MED ORDER — LINACLOTIDE 72 MCG PO CAPS: 72.0000 ug | ORAL_CAPSULE | Freq: Every day | ORAL | 6 refills | Status: DC

## 2020-10-28 NOTE — Progress Notes (Signed)
Subjective:    Patient ID: Tyler Reid, male    DOB: [birth year], 75 y.o.   MRN: [id number]  HPI [name] is a 75 year old white male, established with Dr. Loletha Carrow, who comes in today for follow-up and with complaints of somewhat progressive constipation, and intermittent distention in the left lower abdomen, and a sense of not evacuating bowel with bowel movements. Patient has history of adenomatous colon polyps, sleep apnea, PTSD, nephrolithiasis, and had duodenitis on prior EGD 2014. Last colonoscopy was done in March 2017 with finding of mild diverticulosis of the sigmoid colon and a 4 mm polyp in the transverse colon.  Path consistent with tubular adenoma. Patient has had several adenomatous polyps on colonoscopies previously. Patient says he started using probiotics a few months ago and then started drinking a small amount of goat milk every day and feels that both of these have helped his constipation and that his bowel movements have been moving more regularly and he has not been having the alternating bowel movements that he had been having previously however he continues to struggle with constipation.  He is taking a dose of MiraLAX every night and then 3  stool softeners each morning in order to produce a bowel movement each day.  He says if he does not continue on this regimen his bowels will move.  He is also having some bloating and fullness/distention in the left abdomen prior to bowel movements.  This seems to improve after defecation.  He has not noted any melena or hematochezia. Also despite this regimen he is intermittently taking some milk of magnesia or Epson salts for obstipation when he feels that he needs to empty his bowel. He is concerned about any potential partial blockage either in his colon or higher up in his digestive tract.  He mentions that he knows a couple of people that have been dealing with colon cancer recently.  Review of Systems Pertinent positive and negative  review of systems were noted in the above HPI section.  All other review of systems was otherwise negative.  Outpatient Encounter Medications as of 10/28/2020  Medication Sig  . AMBULATORY NON FORMULARY MEDICATION Take 180 mg by mouth daily. Medication Name: Clear Study drug- Bempodic acid versus placebo  . amLODipine (NORVASC) 5 MG tablet Take 5 mg by mouth daily.  . ASPIRIN LOW DOSE 81 MG EC tablet Take 81 mg by mouth daily.  Marland Kitchen azelastine (ASTELIN) 0.1 % nasal spray Place 1 spray into both nostrils 2 (two) times daily.  . Cholecalciferol (VITAMIN D) 125 MCG (5000 UT) CAPS Take by mouth. One daily  . Evolocumab (REPATHA SURECLICK) 786 MG/ML SOAJ Inject 1 pen into the skin every 14 (fourteen) days.  Marland Kitchen gabapentin (NEURONTIN) 100 MG capsule Take 200 mg by mouth in the morning and at bedtime.  Marland Kitchen linaclotide (LINZESS) 72 MCG capsule Take 1 capsule (72 mcg total) by mouth daily before breakfast.  . metoprolol succinate (TOPROL-XL) 25 MG 24 hr tablet Take 12.5 mg by mouth 2 (two) times daily.  . nitroGLYCERIN (NITROSTAT) 0.4 MG SL tablet Place 1 tablet under the tongue as needed.  Marland Kitchen omeprazole (PRILOSEC) 20 MG capsule Take 20 mg by mouth daily.  Marland Kitchen PEG-KCl-NaCl-NaSulf-Na Asc-C (PLENVU) 140 g SOLR Take 1 kit by mouth as directed.  . polyethylene glycol (MIRALAX / GLYCOLAX) 17 g packet Take 17 g by mouth daily.  . RESTASIS 0.05 % ophthalmic emulsion Place 1 drop into both eyes 2 (two) times daily.  Marland Kitchen triamterene-hydrochlorothiazide (  MAXZIDE-25) 37.5-25 MG tablet Take 0.5 tablets by mouth daily.  . [DISCONTINUED] [employer] PURELAX 17 GM/SCOOP powder Take 17 g by mouth daily.   No facility-administered encounter medications on file as of 10/28/2020.   Allergies  Allergen Reactions  . Niacin And Related Other (See Comments)    Itching,body redness  . Statins Other (See Comments)    Muscle cramps Other reaction(s): Myalgias (intolerance)   Patient Active Problem List   Diagnosis Date Noted  . Coronary  artery calcification seen on CAT scan 08/01/2015  . UNSPECIFIED IRON DEFICIENCY ANEMIA 05/16/2008  . Hyperlipidemia 05/15/2008  . POSTTRAUMATIC STRESS DISORDER 05/15/2008  . HEMORRHOIDS 05/15/2008  . DUODENITIS, WITHOUT HEMORRHAGE 05/15/2008  . HEMOCCULT POSITIVE STOOL 05/15/2008  . SLEEP DISORDER 05/15/2008  . COLONIC POLYPS, ADENOMATOUS, HX OF 05/15/2008  . NEPHROLITHIASIS, HX OF 05/15/2008   Social History   Socioeconomic History  . Marital status: Married    Spouse name: Not on file  . Number of children: 1  . Years of education: 52  . Highest education level: Not on file  Occupational History    Comment: retired  Tobacco Use  . Smoking status: Never Smoker  . Smokeless tobacco: Never Used  Vaping Use  . Vaping Use: Never used  Substance and Sexual Activity  . Alcohol use: No    Alcohol/week: 0.0 standard drinks    Comment: Rare  . Drug use: No  . Sexual activity: Not on file  Other Topics Concern  . Not on file  Social History Narrative  . Not on file   Social Determinants of Health   Financial Resource Strain: Not on file  Food Insecurity: Not on file  Transportation Needs: Not on file  Physical Activity: Not on file  Stress: Not on file  Social Connections: Not on file  Intimate Partner Violence: Not on file    Mr. Labombard's family history includes Breast cancer in his mother; Lung cancer in his mother; Melanoma in his father.      Objective:    Vitals:   10/28/20 1358  BP: 132/70  Pulse: 69  SpO2: 97%    Physical Exam Well-developed well-nourished older WM  in no acute distress.  Height, BPZWCH,852  BMI 25.4  HEENT; nontraumatic normocephalic, EOMI, PE R LA, sclera anicteric. Oropharynx; not examined today Neck; supple, no JVD Cardiovascular; regular rate and rhythm with S1-S2, no murmur rub or gallop Pulmonary; Clear bilaterally Abdomen; soft, nontender, nondistended, no palpable mass or hepatosplenomegaly, bowel sounds are active Rectal;  not done today Skin; benign exam, no jaundice rash or appreciable lesions Extremities; no clubbing cyanosis or edema skin warm and dry Neuro/Psych; alert and oriented x4, grossly nonfocal mood and affect appropriate       Assessment & Plan:   #63 75 year old white male with chronic constipation with some progression over the past few months. Currently requiring daily MiraLAX and stool softeners as well as probiotics. Despite this regimen he continues to feel constipated though passing some stool each day and has a sense that he is not evacuating his bowels well.  Also with intermittent distention in the left lower abdomen which seems to improve with bowel movements.  I think his symptoms are secondary to chronic slow transit constipation, he is also on several medications which may be contributing including Maxide, Toprol, Neurontin, and Norvasc. Rule out occult neoplasm, rule out other intra-abdominal inflammatory process.  #2 history of prior adenomatous colon polyps-due for follow-up colonoscopy last done March 2017 #3 sleep apnea  4.  PTSD #5 diverticulosis #6  status post COVID-19 positive few weeks ago  Plan; start trial of Linzess 72 mcg daily, patient was given samples and prescription sent.  He is asked to call if this is ineffective and can consider increasing dose. He can continue MiraLAX 17 g in 8 ounces of water daily and also can continue stool softeners if needed.  We discussed titrating this regimen. Patient will be scheduled for colonoscopy with Dr. Loletha Carrow .  Procedure discussed in detail with the patient including indications risks and benefits and he is agreeable to proceed. Patient has completed COVID-19 vaccination, and was Covid positive few weeks ago with minimal symptoms If colonoscopy is unrevealing and symptoms do not improve consider cross-sectional imaging.   Nadine Ryle Genia Harold PA-C 10/28/2020   Cc: Leanna Battles, MD

## 2020-10-28 NOTE — Patient Instructions (Signed)
If you are age 76 or older, your body mass index should be between 23-30. Your Body mass index is 25.4 kg/m. If this is out of the aforementioned range listed, please consider follow up with your Primary Care Provider.  If you are age 36 or younger, your body mass index should be between 19-25. Your Body mass index is 25.4 kg/m. If this is out of the aformentioned range listed, please consider follow up with your Primary Care Provider.   You have been scheduled for a colonoscopy. Please follow written instructions given to you at your visit today.  Please pick up your prep supplies at the pharmacy within the next 1-3 days. If you use inhalers (even only as needed), please bring them with you on the day of your procedure.  START Linzess 72 mcg 1 capsule every morning. We have also sent a prescription to your pharmacy.  You may continue to take Miralax 17 grams in 8 ounces of water or juice daily as needed.  Follow up pending the results of your Colonoscopy.  Thank you for entrusting me with your care and choosing [facility].  Amy Esterwood, PA-C

## 2020-10-29 ENCOUNTER — Telehealth: Payer: Self-pay

## 2020-10-29 NOTE — Telephone Encounter (Signed)
Prior Authorization has been submitted through Cover My Meds   Approved: today Your PA request has been approved. Additional information will be provided in the approval communication.

## 2020-10-31 NOTE — Progress Notes (Signed)
____________________________________________________________  Attending physician addendum:  Thank you for sending this case to me. I have reviewed the entire note and agree with the plan.  I agree this is more likely to be med side effect and slow transit than obstructive.  Colonoscopy is reasonable - last one 5 years ago.  Wilfrid Lund, MD  ____________________________________________________________

## 2020-11-11 DIAGNOSIS — H16103 Unspecified superficial keratitis, bilateral: Secondary | ICD-10-CM | POA: Diagnosis not present

## 2020-11-11 DIAGNOSIS — H35371 Puckering of macula, right eye: Secondary | ICD-10-CM | POA: Diagnosis not present

## 2020-11-11 DIAGNOSIS — H353131 Nonexudative age-related macular degeneration, bilateral, early dry stage: Secondary | ICD-10-CM | POA: Diagnosis not present

## 2020-11-11 DIAGNOSIS — H524 Presbyopia: Secondary | ICD-10-CM | POA: Diagnosis not present

## 2020-11-11 DIAGNOSIS — H16223 Keratoconjunctivitis sicca, not specified as Sjogren's, bilateral: Secondary | ICD-10-CM | POA: Diagnosis not present

## 2020-11-11 DIAGNOSIS — Z961 Presence of intraocular lens: Secondary | ICD-10-CM | POA: Diagnosis not present

## 2020-11-11 DIAGNOSIS — H43393 Other vitreous opacities, bilateral: Secondary | ICD-10-CM | POA: Diagnosis not present

## 2020-11-14 ENCOUNTER — Other Ambulatory Visit: Payer: Self-pay

## 2020-11-14 ENCOUNTER — Encounter: Payer: Self-pay | Admitting: Gastroenterology

## 2020-11-14 ENCOUNTER — Ambulatory Visit (AMBULATORY_SURGERY_CENTER): Payer: Medicare Other | Admitting: Gastroenterology

## 2020-11-14 VITALS — BP 115/74 | HR 65 | Temp 97.9°F | Resp 12 | Ht 70.0 in | Wt 177.0 lb

## 2020-11-14 DIAGNOSIS — I10 Essential (primary) hypertension: Secondary | ICD-10-CM | POA: Diagnosis not present

## 2020-11-14 DIAGNOSIS — D124 Benign neoplasm of descending colon: Secondary | ICD-10-CM

## 2020-11-14 DIAGNOSIS — K5909 Other constipation: Secondary | ICD-10-CM

## 2020-11-14 DIAGNOSIS — Z86010 Personal history of colonic polyps: Secondary | ICD-10-CM | POA: Diagnosis not present

## 2020-11-14 MED ORDER — SODIUM CHLORIDE 0.9 % IV SOLN: 500.0000 mL | Freq: Once | INTRAVENOUS | Status: DC

## 2020-11-14 NOTE — Progress Notes (Signed)
Report given to PACU, vss 

## 2020-11-14 NOTE — Op Note (Signed)
[facility] Patient Name: Tyler Reid Procedure Date: 11/14/2020 11:22 AM MRN: [id number] Endoscopist: Mallie Mussel L. Loletha Carrow , MD Age: 75 Referring MD:  Date of Birth: [birth year] Gender: Male Account #: [id number] Procedure:                Colonoscopy Indications:              Surveillance: Personal history of adenomatous                            polyps on last colonoscopy 5 years ago, Incidental                            constipation noted (years, see recent office note                            for details) Medicines:                Monitored Anesthesia Care Procedure:                Pre-Anesthesia Assessment:                           - Prior to the procedure, a History and Physical                            was performed, and patient medications and                            allergies were reviewed. The patient's tolerance of                            previous anesthesia was also reviewed. The risks                            and benefits of the procedure and the sedation                            options and risks were discussed with the patient.                            All questions were answered, and informed consent                            was obtained. Prior Anticoagulants: The patient has                            taken no previous anticoagulant or antiplatelet                            agents. ASA Grade Assessment: II - A patient with                            mild systemic disease. After reviewing the risks  and benefits, the patient was deemed in                            satisfactory condition to undergo the procedure.                           After obtaining informed consent, the colonoscope                            was passed under direct vision. Throughout the                            procedure, the patient's blood pressure, pulse, and                            oxygen saturations were monitored continuously. The                             Olympus CF-HQ190 [id number]) Colonoscope was                            introduced through the anus and advanced to the the                            cecum, identified by appendiceal orifice and                            ileocecal valve. The colonoscopy was performed                            without difficulty. The patient tolerated the                            procedure well. The quality of the bowel                            preparation was good. The ileocecal valve,                            appendiceal orifice, and rectum were photographed.                            The bowel preparation used was Plenvu. Scope In: 11:40:25 AM Scope Out: 11:51:51 AM Scope Withdrawal Time: 0 hours 8 minutes 58 seconds  Total Procedure Duration: 0 hours 11 minutes 26 seconds  Findings:                 The perianal and digital rectal examinations were                            normal.                           A 3 mm polyp was found in the proximal descending  colon. The polyp was sessile. The polyp was removed                            with a cold snare. Resection and retrieval were                            complete.                           The exam was otherwise without abnormality on                            direct and retroflexion views. Complications:            No immediate complications. Estimated Blood Loss:     Estimated blood loss was minimal. Impression:               - One 3 mm polyp in the proximal descending colon,                            removed with a cold snare. Resected and retrieved.                           - The examination was otherwise normal on direct                            and retroflexion views.                           Slow transit constipation exacerbated by side                            effect of medicine(s).                           Multiple treatments have been only moderately                             effective. Recommendation:           - Patient has a contact number available for                            emergencies. The signs and symptoms of potential                            delayed complications were discussed with the                            patient. Return to normal activities tomorrow.                            Written discharge instructions were provided to the                            patient.                           -  Resume previous diet.                           - Continue present medications.                           - Await pathology results.                           - Based on age and current guidelines, no repeat                            surveillance colonoscopy recommended.                           - Miralax one capful in a glass of water twice                            daily.                           - Senokot , one tablet every day if needed to                            manage constipation.                           - Return to our office as neeed. Kwesi Sangha L. Loletha Carrow, MD 11/14/2020 11:59:28 AM This report has been signed electronically.

## 2020-11-14 NOTE — Patient Instructions (Signed)
YOU HAD AN ENDOSCOPIC PROCEDURE TODAY AT [facility]:   Refer to the procedure report that was given to you for any specific questions about what was found during the examination.  If the procedure report does not answer your questions, please call your gastroenterologist to clarify.  If you requested that your care partner not be given the details of your procedure findings, then the procedure report has been included in a sealed envelope for you to review at your convenience later.  YOU SHOULD EXPECT: Some feelings of bloating in the abdomen. Passage of more gas than usual.  Walking can help get rid of the air that was put into your GI tract during the procedure and reduce the bloating. If you had a lower endoscopy (such as a colonoscopy or flexible sigmoidoscopy) you may notice spotting of blood in your stool or on the toilet paper. If you underwent a bowel prep for your procedure, you may not have a normal bowel movement for a few days.  Please Note:  You might notice some irritation and congestion in your nose or some drainage.  This is from the oxygen used during your procedure.  There is no need for concern and it should clear up in a day or so.  SYMPTOMS TO REPORT IMMEDIATELY:   Following lower endoscopy (colonoscopy or flexible sigmoidoscopy):  Excessive amounts of blood in the stool  Significant tenderness or worsening of abdominal pains  Swelling of the abdomen that is new, acute  Fever of 100F or higher   For urgent or emergent issues, a gastroenterologist can be reached at any hour by calling [phone]. Do not use MyChart messaging for urgent concerns.    DIET:  We do recommend a small meal at first, but then you may proceed to your regular diet.  Drink plenty of fluids but you should avoid alcoholic beverages for 24 hours.  MEDICATIONS: Continue present medications. Use Mirilax one capful in a glass of water twice daily. Use Senokot, one tablet every day if  needed to manage constipation.  Follow up with Dr. Loletha Carrow in his office as needed.  Please see handouts given to you by your recovery nurse.  Thank you for allowing [place] to provide for your healthcare needs today.  ACTIVITY:  You should plan to take it easy for the rest of today and you should NOT DRIVE or use heavy machinery until tomorrow (because of the sedation medicines used during the test).    FOLLOW UP: Our staff will call the number listed on your records 48-72 hours following your procedure to check on you and address any questions or concerns that you may have regarding the information given to you following your procedure. If we do not reach you, we will leave a message.  We will attempt to reach you two times.  During this call, we will ask if you have developed any symptoms of COVID 19. If you develop any symptoms (ie: fever, flu-like symptoms, shortness of breath, cough etc.) before then, please call [phone].  If you test positive for Covid 19 in the 2 weeks post procedure, please call and report this information to [place].    If any biopsies were taken you will be contacted by phone or by letter within the next 1-3 weeks.  Please call us at [phone] if you have not heard about the biopsies in 3 weeks.    SIGNATURES/CONFIDENTIALITY: You and/or your care partner have signed paperwork which will be entered  into your electronic medical record.  These signatures attest to the fact that that the information above on your After Visit Summary has been reviewed and is understood.  Full responsibility of the confidentiality of this discharge information lies with you and/or your care-partner.

## 2020-11-14 NOTE — Progress Notes (Signed)
Called to room to assist during endoscopic procedure.  Patient ID and intended procedure confirmed with present staff. Received instructions for my participation in the procedure from the performing physician.  

## 2020-11-18 ENCOUNTER — Telehealth: Payer: Self-pay | Admitting: *Deleted

## 2020-11-18 NOTE — Telephone Encounter (Signed)
Follow up call made. No voice mailbox set up 

## 2020-11-22 ENCOUNTER — Encounter: Payer: Self-pay | Admitting: Gastroenterology

## 2020-12-25 ENCOUNTER — Encounter: Payer: Medicare Other | Admitting: *Deleted

## 2020-12-25 ENCOUNTER — Other Ambulatory Visit: Payer: Self-pay

## 2020-12-25 DIAGNOSIS — Z006 Encounter for examination for normal comparison and control in clinical research program: Secondary | ICD-10-CM

## 2020-12-25 NOTE — Research (Signed)
Subject came into the research clinic today for Visit T14,M36 in the CLEAR research study. There were no new AE's or SAE's to report to sponsor at this time. All concomitant medications were reviewed and updated if applicable. Subject was 100% with IP compliance and new IP was dispensed. Next appointment was scheduled, which will be the End of Study Visit, for Thursday, July 7th, 2022 @ 0800.

## 2021-03-10 ENCOUNTER — Ambulatory Visit (INDEPENDENT_AMBULATORY_CARE_PROVIDER_SITE_OTHER): Payer: Medicare Other | Admitting: Adult Health

## 2021-03-10 ENCOUNTER — Encounter: Payer: Self-pay | Admitting: Adult Health

## 2021-03-10 VITALS — BP 112/60 | HR 59 | Ht 70.0 in | Wt 173.0 lb

## 2021-03-10 DIAGNOSIS — Z9989 Dependence on other enabling machines and devices: Secondary | ICD-10-CM | POA: Diagnosis not present

## 2021-03-10 DIAGNOSIS — G4733 Obstructive sleep apnea (adult) (pediatric): Secondary | ICD-10-CM

## 2021-03-10 NOTE — Patient Instructions (Signed)
Continue using CPAP nightly and greater than 4 hours each night °If your symptoms worsen or you develop new symptoms please let us know.  ° °

## 2021-03-10 NOTE — Progress Notes (Signed)
Cm sent to [facility]
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
## 2021-03-20 ENCOUNTER — Encounter: Payer: Medicare Other | Admitting: *Deleted

## 2021-03-20 ENCOUNTER — Other Ambulatory Visit: Payer: Self-pay

## 2021-03-20 VITALS — BP 124/59 | HR 50 | Resp 20 | Ht 70.0 in | Wt 175.0 lb

## 2021-03-20 DIAGNOSIS — Z006 Encounter for examination for normal comparison and control in clinical research program: Secondary | ICD-10-CM

## 2021-03-20 NOTE — Research (Signed)
Subject came into research clinic today for their End of Study visit for the CLEAR research study. Subject was 100% compliant with their IP. A physical exam was performed by the Sub-I of the study and an EKG. All concomitant medication's have been reviewed and updated if applicable. There are no new AE's or SAE's to report to sponsor at this time. Reminded subject that we will call them in approximately 30 days from today for their post end of study phone call.

## 2021-04-10 ENCOUNTER — Telehealth: Payer: Self-pay | Admitting: *Deleted

## 2021-04-10 DIAGNOSIS — Z006 Encounter for examination for normal comparison and control in clinical research program: Secondary | ICD-10-CM

## 2021-04-10 NOTE — Telephone Encounter (Signed)
I called patient for 30-day post study visit for Clear Study. Patient is doing well with no new adverse events or new concomitant medications. I reminded patient to eat healthy and follow regular exercise program. I thanked patient for his participation in the study.

## 2021-05-21 DIAGNOSIS — H43393 Other vitreous opacities, bilateral: Secondary | ICD-10-CM | POA: Diagnosis not present

## 2021-05-21 DIAGNOSIS — H353131 Nonexudative age-related macular degeneration, bilateral, early dry stage: Secondary | ICD-10-CM | POA: Diagnosis not present

## 2021-05-21 DIAGNOSIS — H16103 Unspecified superficial keratitis, bilateral: Secondary | ICD-10-CM | POA: Diagnosis not present

## 2021-05-21 DIAGNOSIS — H16223 Keratoconjunctivitis sicca, not specified as Sjogren's, bilateral: Secondary | ICD-10-CM | POA: Diagnosis not present

## 2021-05-21 DIAGNOSIS — Z961 Presence of intraocular lens: Secondary | ICD-10-CM | POA: Diagnosis not present

## 2021-05-21 DIAGNOSIS — H35371 Puckering of macula, right eye: Secondary | ICD-10-CM | POA: Diagnosis not present

## 2021-05-21 DIAGNOSIS — H524 Presbyopia: Secondary | ICD-10-CM | POA: Diagnosis not present

## 2021-07-15 DIAGNOSIS — Z23 Encounter for immunization: Secondary | ICD-10-CM | POA: Diagnosis not present

## 2021-07-17 DIAGNOSIS — Z23 Encounter for immunization: Secondary | ICD-10-CM | POA: Diagnosis not present

## 2021-07-28 DIAGNOSIS — J300 Vasomotor rhinitis: Secondary | ICD-10-CM | POA: Diagnosis not present

## 2021-07-28 DIAGNOSIS — K219 Gastro-esophageal reflux disease without esophagitis: Secondary | ICD-10-CM | POA: Diagnosis not present

## 2021-07-28 DIAGNOSIS — J3089 Other allergic rhinitis: Secondary | ICD-10-CM | POA: Diagnosis not present

## 2021-08-08 ENCOUNTER — Other Ambulatory Visit: Payer: Self-pay | Admitting: Cardiovascular Disease

## 2021-08-25 DIAGNOSIS — I10 Essential (primary) hypertension: Secondary | ICD-10-CM | POA: Diagnosis not present

## 2021-08-25 DIAGNOSIS — E291 Testicular hypofunction: Secondary | ICD-10-CM | POA: Diagnosis not present

## 2021-08-25 DIAGNOSIS — E782 Mixed hyperlipidemia: Secondary | ICD-10-CM | POA: Diagnosis not present

## 2021-08-25 DIAGNOSIS — Z125 Encounter for screening for malignant neoplasm of prostate: Secondary | ICD-10-CM | POA: Diagnosis not present

## 2021-09-01 DIAGNOSIS — R42 Dizziness and giddiness: Secondary | ICD-10-CM | POA: Diagnosis not present

## 2021-09-01 DIAGNOSIS — Z0000 Encounter for general adult medical examination without abnormal findings: Secondary | ICD-10-CM | POA: Diagnosis not present

## 2021-09-01 DIAGNOSIS — E782 Mixed hyperlipidemia: Secondary | ICD-10-CM | POA: Diagnosis not present

## 2021-09-01 DIAGNOSIS — I700 Atherosclerosis of aorta: Secondary | ICD-10-CM | POA: Diagnosis not present

## 2021-09-01 DIAGNOSIS — R82998 Other abnormal findings in urine: Secondary | ICD-10-CM | POA: Diagnosis not present

## 2021-09-01 DIAGNOSIS — G2581 Restless legs syndrome: Secondary | ICD-10-CM | POA: Diagnosis not present

## 2021-09-01 DIAGNOSIS — I10 Essential (primary) hypertension: Secondary | ICD-10-CM | POA: Diagnosis not present

## 2021-09-01 DIAGNOSIS — K219 Gastro-esophageal reflux disease without esophagitis: Secondary | ICD-10-CM | POA: Diagnosis not present

## 2021-09-01 DIAGNOSIS — I2510 Atherosclerotic heart disease of native coronary artery without angina pectoris: Secondary | ICD-10-CM | POA: Diagnosis not present

## 2021-09-01 DIAGNOSIS — G4733 Obstructive sleep apnea (adult) (pediatric): Secondary | ICD-10-CM | POA: Diagnosis not present

## 2021-09-01 DIAGNOSIS — Z23 Encounter for immunization: Secondary | ICD-10-CM | POA: Diagnosis not present

## 2021-09-01 DIAGNOSIS — T466X5A Adverse effect of antihyperlipidemic and antiarteriosclerotic drugs, initial encounter: Secondary | ICD-10-CM | POA: Diagnosis not present

## 2021-09-21 ENCOUNTER — Encounter: Payer: Self-pay | Admitting: Cardiovascular Disease
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
## 2021-09-22 ENCOUNTER — Encounter: Payer: Self-pay | Admitting: Cardiovascular Disease

## 2021-09-22 ENCOUNTER — Other Ambulatory Visit: Payer: Self-pay

## 2021-09-22 ENCOUNTER — Ambulatory Visit (INDEPENDENT_AMBULATORY_CARE_PROVIDER_SITE_OTHER): Payer: Medicare Other | Admitting: Cardiovascular Disease

## 2021-09-22 VITALS — BP 138/70 | HR 74 | Ht 70.0 in | Wt 180.8 lb

## 2021-09-22 DIAGNOSIS — I2510 Atherosclerotic heart disease of native coronary artery without angina pectoris: Secondary | ICD-10-CM | POA: Diagnosis not present

## 2021-09-22 NOTE — Patient Instructions (Signed)
Medication Instructions:  Your physician recommends that you continue on your current medications as directed. Please refer to the Current Medication list given to you today.  *If you need a refill on your cardiac medications before your next appointment, please call your pharmacy*   Lab Work: none If you have labs (blood work) drawn today and your tests are completely normal, you will receive your results only by: MyChart Message (if you have MyChart) OR A paper copy in the mail If you have any lab test that is abnormal or we need to change your treatment, we will call you to review the results.   Testing/Procedures: none   Follow-Up: At [facility], you and your health needs are our priority.  As part of our continuing mission to provide you with exceptional heart care, we have created designated Provider Care Teams.  These Care Teams include your primary Cardiologist (physician) and Advanced Practice Providers (APPs -  Physician Assistants and Nurse Practitioners) who all work together to provide you with the care you need, when you need it.  We recommend signing up for the patient portal called "MyChart".  Sign up information is provided on this After Visit Summary.  MyChart is used to connect with patients for Virtual Visits (Telemedicine).  Patients are able to view lab/test results, encounter notes, upcoming appointments, etc.  Non-urgent messages can be sent to your provider as well.   To learn more about what you can do with MyChart, go to [email or web address].    

## 2021-10-07 DIAGNOSIS — L570 Actinic keratosis: Secondary | ICD-10-CM | POA: Diagnosis not present

## 2021-10-07 DIAGNOSIS — Z1283 Encounter for screening for malignant neoplasm of skin: Secondary | ICD-10-CM | POA: Diagnosis not present

## 2021-10-07 DIAGNOSIS — X32XXXD Exposure to sunlight, subsequent encounter: Secondary | ICD-10-CM | POA: Diagnosis not present

## 2021-10-07 DIAGNOSIS — D225 Melanocytic nevi of trunk: Secondary | ICD-10-CM | POA: Diagnosis not present

## 2021-11-19 DIAGNOSIS — H43393 Other vitreous opacities, bilateral: Secondary | ICD-10-CM | POA: Diagnosis not present

## 2021-11-19 DIAGNOSIS — H524 Presbyopia: Secondary | ICD-10-CM | POA: Diagnosis not present

## 2021-11-19 DIAGNOSIS — Z961 Presence of intraocular lens: Secondary | ICD-10-CM | POA: Diagnosis not present

## 2021-11-19 DIAGNOSIS — H35371 Puckering of macula, right eye: Secondary | ICD-10-CM | POA: Diagnosis not present

## 2021-11-19 DIAGNOSIS — H353131 Nonexudative age-related macular degeneration, bilateral, early dry stage: Secondary | ICD-10-CM | POA: Diagnosis not present

## 2021-11-19 DIAGNOSIS — H16223 Keratoconjunctivitis sicca, not specified as Sjogren's, bilateral: Secondary | ICD-10-CM | POA: Diagnosis not present

## 2021-12-03 ENCOUNTER — Ambulatory Visit (INDEPENDENT_AMBULATORY_CARE_PROVIDER_SITE_OTHER): Payer: Medicare Other | Admitting: Ophthalmology

## 2021-12-03 ENCOUNTER — Encounter (INDEPENDENT_AMBULATORY_CARE_PROVIDER_SITE_OTHER): Payer: Self-pay | Admitting: Ophthalmology

## 2021-12-03 ENCOUNTER — Other Ambulatory Visit: Payer: Self-pay

## 2021-12-03 DIAGNOSIS — G4733 Obstructive sleep apnea (adult) (pediatric): Secondary | ICD-10-CM | POA: Diagnosis not present

## 2021-12-03 DIAGNOSIS — Z9989 Dependence on other enabling machines and devices: Secondary | ICD-10-CM

## 2021-12-03 DIAGNOSIS — Z9889 Other specified postprocedural states: Secondary | ICD-10-CM | POA: Diagnosis not present

## 2021-12-03 DIAGNOSIS — H35073 Retinal telangiectasis, bilateral: Secondary | ICD-10-CM

## 2021-12-03 DIAGNOSIS — I2510 Atherosclerotic heart disease of native coronary artery without angina pectoris: Secondary | ICD-10-CM | POA: Diagnosis not present
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
## 2021-12-03 NOTE — Assessment & Plan Note (Addendum)
No signs of progression of CME on CPAP ? ?Macular telangiectasis (MAC-TEL), or parafoveal telangiectasis is a condition of "unknown" cause.  Findings in or near the macula (center of vision) consist of microaneurysms (leaking small capillaries), often with leakage of fluid which in the active phase can impact fine discriminatory vision, and in some cases trigger profound scarring in the macula, with severe permanent vision loss.  Standard treatment is observation and periodic examinations to monitor for treatable complications.   The cause  of this condition is "unknown".  However, the practice of Dr. Zadie Rhine has discovered an association with sleep apnea with its nightly periods of low oxygen in the blood stream (hypoxia), retained carbon dioxide (hypercapnia), associated with transient nocturnal hypertensive episodes.   More recently, some patients also been found to have advanced lung disease, whether asthma or COPD, with similar findings.  Dr. Zadie Rhine has been evaluating the association of sleep apnea, nightly hypoxia, and Macular telangiectasis for over 18 years.  Most patients are found to be noncompliant with sleep apnea therapy or testing in the past.  Resumption of CPAP or similar therapy is strongly recommended if ordered in the past.  Upon review of risk factors or findings positive for sleep apnea, more formal, extensive sleep laboratory or home testing, may be recommended.  Numerous patients, proven to have MAC-TEL, have improved or resolved their ey condition promptly, within weeks, of the use of nighttime oxygen supplementation or continuous positive airway pressure (CPAP). ?

## 2021-12-03 NOTE — Assessment & Plan Note (Signed)
Compliant with CPAP use. 

## 2021-12-03 NOTE — Assessment & Plan Note (Signed)
OD looks great no recurrence of ERM ?

## 2022-02-03 DIAGNOSIS — T148XXA Other injury of unspecified body region, initial encounter: Secondary | ICD-10-CM | POA: Diagnosis not present
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
## 2022-03-03 ENCOUNTER — Ambulatory Visit (INDEPENDENT_AMBULATORY_CARE_PROVIDER_SITE_OTHER): Payer: Medicare Other | Admitting: Adult Health

## 2022-03-03 ENCOUNTER — Encounter: Payer: Self-pay | Admitting: Adult Health

## 2022-03-03 VITALS — BP 120/73 | HR 59 | Ht 70.0 in | Wt 180.8 lb

## 2022-03-03 DIAGNOSIS — I2510 Atherosclerotic heart disease of native coronary artery without angina pectoris: Secondary | ICD-10-CM | POA: Diagnosis not present

## 2022-03-03 DIAGNOSIS — G4733 Obstructive sleep apnea (adult) (pediatric): Secondary | ICD-10-CM | POA: Diagnosis not present

## 2022-03-03 DIAGNOSIS — Z9989 Dependence on other enabling machines and devices: Secondary | ICD-10-CM

## 2022-03-03 NOTE — Patient Instructions (Addendum)
Continue using CPAP nightly and greater than 4 hours each night Change pressure to set pressure of 10 call in 1 month for mask download Mask refitting If your symptoms worsen or you develop new symptoms please let us know.

## 2022-03-04 NOTE — Progress Notes (Addendum)
Message to [facility] for new orders.Denyse Amass, RN; Vanessa Ralphs got it!      Previous Messages    ----- Message -----  From: Brandon Melnick, RN  Sent: 03/04/2022   7:18 AM EDT  To: Vanessa Ralphs; Marchelle Gearing   New order in Epic for this pt   Tyler Reid  Male, 76 y.o., [birth year]  MRN:  [id number]  Thanks Lovey Newcomer RN

## 2022-05-11 DIAGNOSIS — Z961 Presence of intraocular lens: Secondary | ICD-10-CM | POA: Diagnosis not present

## 2022-05-11 DIAGNOSIS — H43393 Other vitreous opacities, bilateral: Secondary | ICD-10-CM | POA: Diagnosis not present

## 2022-05-11 DIAGNOSIS — H35371 Puckering of macula, right eye: Secondary | ICD-10-CM | POA: Diagnosis not present

## 2022-05-11 DIAGNOSIS — H353131 Nonexudative age-related macular degeneration, bilateral, early dry stage: Secondary | ICD-10-CM | POA: Diagnosis not present

## 2022-05-11 DIAGNOSIS — H16223 Keratoconjunctivitis sicca, not specified as Sjogren's, bilateral: Secondary | ICD-10-CM | POA: Diagnosis not present

## 2022-05-11 DIAGNOSIS — H524 Presbyopia: Secondary | ICD-10-CM | POA: Diagnosis not present

## 2022-05-11 DIAGNOSIS — H02051 Trichiasis without entropian right upper eyelid: Secondary | ICD-10-CM | POA: Diagnosis not present

## 2022-07-18 ENCOUNTER — Other Ambulatory Visit: Payer: Self-pay | Admitting: Cardiovascular Disease

## 2022-08-18 DIAGNOSIS — Z23 Encounter for immunization: Secondary | ICD-10-CM | POA: Diagnosis not present

## 2022-09-23 ENCOUNTER — Ambulatory Visit (INDEPENDENT_AMBULATORY_CARE_PROVIDER_SITE_OTHER): Payer: Medicare Other | Admitting: Adult Health

## 2022-09-23 ENCOUNTER — Encounter: Payer: Self-pay | Admitting: Adult Health

## 2022-09-23 VITALS — BP 122/71 | HR 51 | Ht 70.0 in | Wt 177.4 lb

## 2022-09-23 DIAGNOSIS — G4733 Obstructive sleep apnea (adult) (pediatric): Secondary | ICD-10-CM | POA: Diagnosis not present
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
## 2022-09-23 NOTE — Patient Instructions (Signed)
Your Plan:  Continue using dental device. We will do home sleep test to see how effective your dental device is for treating OSA     Thank you for coming to see [place] at [facility]. I hope we have been able to provide you high quality care today.  You may receive a patient satisfaction survey over the next few weeks. We would appreciate your feedback and comments so that we may continue to improve ourselves and the health of our patients.

## 2022-09-24 DIAGNOSIS — Z0000 Encounter for general adult medical examination without abnormal findings: Secondary | ICD-10-CM | POA: Diagnosis not present

## 2022-10-06 DIAGNOSIS — X32XXXD Exposure to sunlight, subsequent encounter: Secondary | ICD-10-CM | POA: Diagnosis not present

## 2022-10-06 DIAGNOSIS — D225 Melanocytic nevi of trunk: Secondary | ICD-10-CM | POA: Diagnosis not present

## 2022-10-06 DIAGNOSIS — Z1283 Encounter for screening for malignant neoplasm of skin: Secondary | ICD-10-CM | POA: Diagnosis not present

## 2022-10-06 DIAGNOSIS — D485 Neoplasm of uncertain behavior of skin: Secondary | ICD-10-CM | POA: Diagnosis not present

## 2022-10-06 DIAGNOSIS — L570 Actinic keratosis: Secondary | ICD-10-CM | POA: Diagnosis not present

## 2022-10-13 DIAGNOSIS — Z125 Encounter for screening for malignant neoplasm of prostate: Secondary | ICD-10-CM | POA: Diagnosis not present

## 2022-10-13 DIAGNOSIS — Z1212 Encounter for screening for malignant neoplasm of rectum: Secondary | ICD-10-CM | POA: Diagnosis not present

## 2022-10-13 DIAGNOSIS — D649 Anemia, unspecified: Secondary | ICD-10-CM | POA: Diagnosis not present

## 2022-10-13 DIAGNOSIS — R7989 Other specified abnormal findings of blood chemistry: Secondary | ICD-10-CM | POA: Diagnosis not present

## 2022-10-13 DIAGNOSIS — I10 Essential (primary) hypertension: Secondary | ICD-10-CM | POA: Diagnosis not present

## 2022-10-13 DIAGNOSIS — E291 Testicular hypofunction: Secondary | ICD-10-CM | POA: Diagnosis not present

## 2022-10-13 DIAGNOSIS — E782 Mixed hyperlipidemia: Secondary | ICD-10-CM | POA: Diagnosis not present

## 2022-10-13 DIAGNOSIS — R030 Elevated blood-pressure reading, without diagnosis of hypertension: Secondary | ICD-10-CM | POA: Diagnosis not present

## 2022-10-13 DIAGNOSIS — K219 Gastro-esophageal reflux disease without esophagitis: Secondary | ICD-10-CM | POA: Diagnosis not present

## 2022-10-20 DIAGNOSIS — I2510 Atherosclerotic heart disease of native coronary artery without angina pectoris: Secondary | ICD-10-CM | POA: Diagnosis not present

## 2022-10-20 DIAGNOSIS — I700 Atherosclerosis of aorta: Secondary | ICD-10-CM | POA: Diagnosis not present

## 2022-10-20 DIAGNOSIS — I10 Essential (primary) hypertension: Secondary | ICD-10-CM | POA: Diagnosis not present

## 2022-10-20 DIAGNOSIS — Z0000 Encounter for general adult medical examination without abnormal findings: Secondary | ICD-10-CM | POA: Diagnosis not present

## 2022-10-20 DIAGNOSIS — T466X5A Adverse effect of antihyperlipidemic and antiarteriosclerotic drugs, initial encounter: Secondary | ICD-10-CM | POA: Diagnosis not present

## 2022-10-20 DIAGNOSIS — G4733 Obstructive sleep apnea (adult) (pediatric): Secondary | ICD-10-CM | POA: Diagnosis not present

## 2022-10-20 DIAGNOSIS — R82998 Other abnormal findings in urine: Secondary | ICD-10-CM | POA: Diagnosis not present

## 2022-10-20 DIAGNOSIS — M791 Myalgia, unspecified site: Secondary | ICD-10-CM | POA: Diagnosis not present

## 2022-10-20 DIAGNOSIS — E291 Testicular hypofunction: Secondary | ICD-10-CM | POA: Diagnosis not present

## 2022-10-20 DIAGNOSIS — J45909 Unspecified asthma, uncomplicated: Secondary | ICD-10-CM | POA: Diagnosis not present

## 2022-11-03 ENCOUNTER — Ambulatory Visit (INDEPENDENT_AMBULATORY_CARE_PROVIDER_SITE_OTHER): Payer: Medicare Other | Admitting: Neurology

## 2022-11-03 DIAGNOSIS — R001 Bradycardia, unspecified: Secondary | ICD-10-CM

## 2022-11-03 DIAGNOSIS — G4733 Obstructive sleep apnea (adult) (pediatric): Secondary | ICD-10-CM

## 2022-11-05 NOTE — Progress Notes (Signed)
See procedure note.

## 2022-11-09 NOTE — Procedures (Signed)
[facility]  HOME SLEEP TEST (Watch PAT) REPORT  STUDY DATE: 11/03/2022  DOB: 1946/04/14  MRN: [id number]  ORDERING CLINICIAN: [name], MD, PhD, Ward Givens, NP   REFERRING CLINICIAN: Donnajean Lopes, MD (PCP)  CLINICAL INFORMATION/HISTORY: 77 year old male with an underlying medical history of rosacea, cleft palate with status post surgery, nephrolithiasis, shoulder pain, vitamin D deficiency, and borderline overweight state, who presents for reevaluation of his obstructive sleep apnea, while on treatment with an oral appliance. He also has a AutoPap machine which he has not used for the past several months.  He had evidence of central sleep disordered breathing while on AutoPap of 5 to 12 cm with EPR of 3, total AHI was elevated at 8.9 cm in the month of mid May through mid June 2023.  He presents for home sleep test with oral appliance in place.  BMI: 25.2 kg/m  FINDINGS:   Sleep Summary:   Total Recording Time (hours, min): 7 hours, 40 min  Total Sleep Time (hours, min):  6 hours, 20 min  Percent REM (%):    13.4%   Respiratory Indices:   Calculated pAHI (per hour):  27.9/hour      pAHI with 4% desaturation criteria: 14.8/h  REM pAHI:    27.1/hour       NREM pAHI: 28.1/hour  Oxygen Saturation Statistics:    Oxygen Saturation (%) Mean: 94%   Minimum oxygen saturation (%):                 88%   O2 Saturation Range (%): 88 - 99%    O2 Saturation (minutes) <=88%: 0.2 min  Pulse Rate Statistics:   Pulse Mean (bpm):    52/min    Pulse Range (42 - 73/min)   IMPRESSION: OSA (obstructive sleep apnea) Bradycardia  RECOMMENDATION:  This home sleep test demonstrates moderate obstructive sleep apnea with a total AHI of 27.9/hour and O2 nadir of 88%.  This home sleep test was reportedly conducted with his oral appliance in place, I recommend verifying this information.  He has moderate residual sleep apnea and I recommend adjustment of his  oral appliance if feasible and follow-up with his dentist or orthodontist regarding this.  Alternatively, treatment with AutoPap therapy can also be resumed.  Bradycardia was noted with an average heart rate of 52 bpm.  The patient has a history of heart rates in the 50s during wakefulness.  Clinical correlation is recommended.  The patient is well-known to cardiology.  Of note, the patient is on a beta-blocker.   Please note that untreated obstructive sleep apnea may carry additional perioperative morbidity. Patients with significant obstructive sleep apnea should receive perioperative PAP therapy and the surgeons and particularly the anesthesiologist should be informed of the diagnosis and the severity of the sleep disordered breathing. The patient should be cautioned not to drive, work at heights, or operate dangerous or heavy equipment when tired or sleepy. Review and reiteration of good sleep hygiene measures should be pursued with any patient. Other causes of the patient's symptoms, including circadian rhythm disturbances, an underlying mood disorder, medication effect and/or an underlying medical problem cannot be ruled out based on this test. Clinical correlation is recommended.  The patient and his referring provider will be notified of the test results. The patient will be seen in follow up in sleep clinic at [facility].  I certify that I have reviewed the raw data recording prior to the issuance of this report in accordance with the  standards of the American Academy of Sleep Medicine (AASM).    INTERPRETING PHYSICIAN:   [name], MD, PhD Medical Director, [facility]) [place], ABPN (Neurology and Sleep)   [facility] [address] [place], [place] [address] [phone]

## 2022-11-12 ENCOUNTER — Telehealth: Payer: Self-pay | Admitting: *Deleted

## 2022-11-12 DIAGNOSIS — G4733 Obstructive sleep apnea (adult) (pediatric): Secondary | ICD-10-CM

## 2022-11-12 NOTE — Telephone Encounter (Signed)
-----   Message from Ward Givens, NP sent at 11/11/2022  2:01 PM EST ----- Please call and advise patient that sleep study still showed moderate sleep apnea with a total AHI of 27 events an hour.  His dental device will need to be adjusted.  His dentist should be able to do this or if he needs another referral we can place.

## 2022-11-12 NOTE — Telephone Encounter (Addendum)
Spoke to patient gave sleep study results Pt agreed to referral Pt wants referral to be sent to  Dr.David Mcleod. Will place referral  this am Pt thanked me for calling

## 2022-11-16 ENCOUNTER — Telehealth: Payer: Self-pay | Admitting: Adult Health

## 2022-11-16 DIAGNOSIS — H35073 Retinal telangiectasis, bilateral: Secondary | ICD-10-CM | POA: Diagnosis not present

## 2022-11-16 NOTE — Telephone Encounter (Signed)
Referral for dentistry fax to [facility] to see Dr. Jerold Coombe. Phone: [phone], Fax: [phone].

## 2022-12-23 DIAGNOSIS — M545 Low back pain, unspecified: Secondary | ICD-10-CM | POA: Insufficient documentation

## 2022-12-25 DIAGNOSIS — M5451 Vertebrogenic low back pain: Secondary | ICD-10-CM | POA: Diagnosis not present

## 2023-01-04 DIAGNOSIS — M545 Low back pain, unspecified: Secondary | ICD-10-CM | POA: Insufficient documentation

## 2023-01-04 DIAGNOSIS — M5451 Vertebrogenic low back pain: Secondary | ICD-10-CM | POA: Diagnosis not present

## 2023-01-06 DIAGNOSIS — M5451 Vertebrogenic low back pain: Secondary | ICD-10-CM | POA: Diagnosis not present

## 2023-01-11 DIAGNOSIS — H16223 Keratoconjunctivitis sicca, not specified as Sjogren's, bilateral: Secondary | ICD-10-CM | POA: Diagnosis not present

## 2023-01-11 DIAGNOSIS — H35073 Retinal telangiectasis, bilateral: Secondary | ICD-10-CM | POA: Diagnosis not present

## 2023-01-11 DIAGNOSIS — Z961 Presence of intraocular lens: Secondary | ICD-10-CM | POA: Diagnosis not present

## 2023-01-11 DIAGNOSIS — H43393 Other vitreous opacities, bilateral: Secondary | ICD-10-CM | POA: Diagnosis not present

## 2023-01-11 DIAGNOSIS — H02051 Trichiasis without entropian right upper eyelid: Secondary | ICD-10-CM | POA: Diagnosis not present

## 2023-01-11 DIAGNOSIS — H35371 Puckering of macula, right eye: Secondary | ICD-10-CM | POA: Diagnosis not present

## 2023-01-11 DIAGNOSIS — H524 Presbyopia: Secondary | ICD-10-CM | POA: Diagnosis not present

## 2023-01-12 DIAGNOSIS — M5451 Vertebrogenic low back pain: Secondary | ICD-10-CM | POA: Diagnosis not present

## 2023-01-14 DIAGNOSIS — M5451 Vertebrogenic low back pain: Secondary | ICD-10-CM | POA: Diagnosis not present

## 2023-01-18 DIAGNOSIS — M5451 Vertebrogenic low back pain: Secondary | ICD-10-CM | POA: Diagnosis not present

## 2023-01-21 DIAGNOSIS — M5451 Vertebrogenic low back pain: Secondary | ICD-10-CM | POA: Diagnosis not present

## 2023-01-22 ENCOUNTER — Telehealth: Payer: Self-pay | Admitting: Pharmacist

## 2023-01-22 DIAGNOSIS — I2510 Atherosclerotic heart disease of native coronary artery without angina pectoris: Secondary | ICD-10-CM

## 2023-01-22 NOTE — Telephone Encounter (Signed)
Pt left message on my direct line. Called pt back, states he's having trouble tolerating his Repatha. Experiencing muscle weakness that's been progressively getting worse. Having trouble getting up out of his chair. He stopped taking his Repatha and within 2 weeks could get out of his chair again and has been feeling much better since then. Stopped taking Repatha about 1 month ago.  Previously in the CLEAR trial studying Nexletol but was in placebo arm. Previously intolerant to rosuvastatin, atorvastatin 10mg  and 20mg  daily, simvastatin, and pravastatin (muscle aches and weakness with all). Had ezetimibe listed under prior intolerances as well but pt does not recall this.  Pt wishes to try Wilber Bihari - tried submitting PA to his FEP plan, received a message that his insurance does not cover med.  He is willing to try Nexlizet - PA submitted, Key: [id number] - insurance requires LDL > 70 in past 6 months. His lipids were checked in January at PCP and LDL was 40 because he was still on Repatha at the time. Last dose was 4 weeks ago on April 10th.  Will recheck updated baseline labs in another 2-3 weeks. As long as LDL is at least 70, will submit for Nexlizet coverage.  20 minutes spent on the phone with patient.

## 2023-01-25 DIAGNOSIS — M5451 Vertebrogenic low back pain: Secondary | ICD-10-CM | POA: Diagnosis not present

## 2023-01-26 DIAGNOSIS — L308 Other specified dermatitis: Secondary | ICD-10-CM | POA: Diagnosis not present

## 2023-01-26 DIAGNOSIS — Z1283 Encounter for screening for malignant neoplasm of skin: Secondary | ICD-10-CM | POA: Diagnosis not present

## 2023-01-26 DIAGNOSIS — D225 Melanocytic nevi of trunk: Secondary | ICD-10-CM | POA: Diagnosis not present

## 2023-01-28 DIAGNOSIS — M5451 Vertebrogenic low back pain: Secondary | ICD-10-CM | POA: Diagnosis not present

## 2023-02-11 ENCOUNTER — Ambulatory Visit: Payer: Medicare Other | Attending: Cardiovascular Disease

## 2023-02-11 DIAGNOSIS — I2510 Atherosclerotic heart disease of native coronary artery without angina pectoris: Secondary | ICD-10-CM

## 2023-02-11 LAB — LIPID PANEL
Chol/HDL Ratio: 3 ratio (ref 0.0–5.0)
Cholesterol, Total: 122 mg/dL (ref 100–199)
HDL: 41 mg/dL (ref >39)
LDL Chol Calc (NIH): 53 mg/dL (ref 0–99)
Triglycerides: 167 mg/dL — ABNORMAL HIGH (ref 0–149)
VLDL Cholesterol Cal: 28 mg/dL (ref 5–40)

## 2023-02-15 ENCOUNTER — Telehealth: Payer: Self-pay | Admitting: Pharmacist

## 2023-02-15 NOTE — Telephone Encounter (Signed)
See phone note 5/10 for context. LDL very well controlled at 53 on no lipid meds. Plan was to submit for Nexlizet if LDL > 70. Doesn't need med at this time based on labs, and insurance would not cover Nelxizet with current LDL either.Pt aware of lab results. Can recheck lipids in 3 months and if LDL is > 70 pursue Nexlizet coverage at that time. Pt would like to call clinic closer to that day to schedule lab work.

## 2023-03-08 ENCOUNTER — Ambulatory Visit: Payer: Medicare Other | Admitting: Podiatry

## 2023-03-08 ENCOUNTER — Encounter: Payer: Self-pay | Admitting: Podiatry

## 2023-03-08 ENCOUNTER — Ambulatory Visit (INDEPENDENT_AMBULATORY_CARE_PROVIDER_SITE_OTHER): Payer: Medicare Other | Admitting: Podiatry

## 2023-03-08 DIAGNOSIS — M7752 Other enthesopathy of left foot: Secondary | ICD-10-CM

## 2023-03-08 DIAGNOSIS — M21622 Bunionette of left foot: Secondary | ICD-10-CM | POA: Diagnosis not present

## 2023-03-08 MED ORDER — TRIAMCINOLONE ACETONIDE 10 MG/ML IJ SUSP
10.0000 mg | Freq: Once | INTRAMUSCULAR | Status: AC
Start: 2023-03-08 — End: 2023-03-08
  Administered 2023-03-08: 10 mg

## 2023-03-08 NOTE — Progress Notes (Signed)
Subjective:   Patient ID: Eustace Quail, male   DOB: 77 y.o.   MRN: [id number]   HPI Patient presents stating has developed inflammation in his left foot it has been very sore to walk on is been going on for a long time worse over the last few months   Review of Systems  All other systems reviewed and are negative.       Objective:  Physical Exam Vitals and nursing note reviewed.  Constitutional:      Appearance: He is well-developed.  Pulmonary:     Effort: Pulmonary effort is normal.  Musculoskeletal:        General: Normal range of motion.  Skin:    General: Skin is warm.  Neurological:     Mental Status: He is alert.     Patient has inflammation had of fifth metatarsal left foot with fluid buildup keratotic lesion formation.  Neurovascular status intact muscle strength adequate range of motion adequate with patient found to have fluid buildup and pain around the mentioned area with good digital perfusion well-oriented x 3     Assessment:  Tailor's bunion deformity fluid buildup lesion formation exquisite pain at the head of the fifth metatarsal     Plan:  H&P performed condition discussed at great length.  I went ahead today and I did do sterile prep injected the plantar capsule fifth MPJ 3 mg Dexasone Kenalog 5 mg Xylocaine and went ahead and did courtesy debridement of lesion discussed possible orthotics possible fifth head metatarsal resection explaining recovery to patient.  Patient will be seen back to recheck

## 2023-05-18 DIAGNOSIS — I10 Essential (primary) hypertension: Secondary | ICD-10-CM | POA: Diagnosis not present

## 2023-05-18 DIAGNOSIS — R413 Other amnesia: Secondary | ICD-10-CM | POA: Diagnosis not present

## 2023-05-18 DIAGNOSIS — M6281 Muscle weakness (generalized): Secondary | ICD-10-CM | POA: Diagnosis not present

## 2023-05-18 DIAGNOSIS — R2681 Unsteadiness on feet: Secondary | ICD-10-CM | POA: Diagnosis not present

## 2023-05-18 DIAGNOSIS — G4733 Obstructive sleep apnea (adult) (pediatric): Secondary | ICD-10-CM | POA: Diagnosis not present

## 2023-05-24 ENCOUNTER — Telehealth: Payer: Self-pay | Admitting: Pharmacist

## 2023-05-24 DIAGNOSIS — I2510 Atherosclerotic heart disease of native coronary artery without angina pectoris: Secondary | ICD-10-CM

## 2023-05-24 NOTE — Telephone Encounter (Signed)
Spoke with pt, scheduled lipids for tomorrow. If LDL is > 70, should have coverage for Nexlizet.

## 2023-05-24 NOTE — Telephone Encounter (Signed)
Pt left voicemail for me. I returned his call and left a message.

## 2023-05-25 ENCOUNTER — Ambulatory Visit: Payer: Medicare Other | Attending: Cardiovascular Disease

## 2023-05-25 DIAGNOSIS — I2510 Atherosclerotic heart disease of native coronary artery without angina pectoris: Secondary | ICD-10-CM

## 2023-05-26 LAB — LIPID PANEL
Chol/HDL Ratio: 4.3 ratio (ref 0.0–5.0)
Cholesterol, Total: 211 mg/dL — ABNORMAL HIGH (ref 100–199)
HDL: 49 mg/dL (ref >39)
LDL Chol Calc (NIH): 138 mg/dL — ABNORMAL HIGH (ref 0–99)
Triglycerides: 132 mg/dL (ref 0–149)
VLDL Cholesterol Cal: 24 mg/dL (ref 5–40)

## 2023-05-26 NOTE — Telephone Encounter (Addendum)
Updated lipid panel on no medication shows LDL of 138.  He is previously intolerant to: rosuvastatin, atorvastatin 10mg  and 20mg  daily, simvastatin, pravastatin, and Repatha - he experienced muscle aches and weakness with all. Had ezetimibe listed under prior intolerances as well but pt does not recall this.   LDL goal < 70 due to CAD - calcifications noted on chest CT. Will submit prior authorization for Nexlizet. Key [id number]. Pt is aware. Asks about Leqvio as well, mentioned this could potentially be a back up depending on insurance coverage and tolerability for Nexlizet.

## 2023-05-28 MED ORDER — NEXLIZET 180-10 MG PO TABS: 1.0000 | ORAL_TABLET | Freq: Every day | ORAL | 11 refills | Status: DC

## 2023-05-28 NOTE — Telephone Encounter (Addendum)
Nexlizet prior auth approved through 05/25/24. Pt has commercial insurance, $10 copay card activated and attached to rx, pt does not have mychart set up to send him the info. I have called pt and provided him with copay card info:  BIN [id number] PCN [employer] GRP [id number] ID [id number]  He will call clinic to schedule labs in mid November.

## 2023-05-28 NOTE — Telephone Encounter (Signed)
Pt called again, states copay card made Nexlizet copay ~$60 instead of $160. Not sure why it didn't reduce it to $10. He will try calling Nexlizet copay card # to see if they can assist further.

## 2023-05-28 NOTE — Addendum Note (Signed)
Addended by: Fancy Dunkley E on: 05/28/2023 08:28 AM   Modules accepted: Orders

## 2023-07-05 DIAGNOSIS — R413 Other amnesia: Secondary | ICD-10-CM | POA: Diagnosis not present

## 2023-07-05 DIAGNOSIS — R2681 Unsteadiness on feet: Secondary | ICD-10-CM | POA: Diagnosis not present

## 2023-07-13 DIAGNOSIS — Z23 Encounter for immunization: Secondary | ICD-10-CM | POA: Diagnosis not present

## 2023-07-27 ENCOUNTER — Telehealth: Payer: Self-pay

## 2023-07-27 NOTE — Telephone Encounter (Signed)
We received a new referral from this patients primary care provider, Dr Jarold Motto, for memory loss and shrinkage on L side of brain on MRI. He has seen Dr Frances Furbish in the past in the sleep lab but is not wanting to follow up with her regarding this. He would like to see Dr Lucia Gaskins. He has been advised that Dr Lucia Gaskins is currently scheduling into March for new patients and is still requesting the switch.  Please advise if this is acceptable.

## 2023-07-28 DIAGNOSIS — R051 Acute cough: Secondary | ICD-10-CM | POA: Diagnosis not present

## 2023-07-28 DIAGNOSIS — R0981 Nasal congestion: Secondary | ICD-10-CM | POA: Diagnosis not present

## 2023-07-28 DIAGNOSIS — J309 Allergic rhinitis, unspecified: Secondary | ICD-10-CM | POA: Diagnosis not present

## 2023-07-28 NOTE — Telephone Encounter (Signed)
Ok with switch  

## 2023-08-02 DIAGNOSIS — I10 Essential (primary) hypertension: Secondary | ICD-10-CM | POA: Diagnosis not present

## 2023-08-02 DIAGNOSIS — R2681 Unsteadiness on feet: Secondary | ICD-10-CM | POA: Diagnosis not present

## 2023-08-02 DIAGNOSIS — R251 Tremor, unspecified: Secondary | ICD-10-CM | POA: Diagnosis not present

## 2023-08-02 DIAGNOSIS — G4733 Obstructive sleep apnea (adult) (pediatric): Secondary | ICD-10-CM | POA: Diagnosis not present

## 2023-08-02 DIAGNOSIS — I499 Cardiac arrhythmia, unspecified: Secondary | ICD-10-CM | POA: Diagnosis not present

## 2023-08-02 DIAGNOSIS — R413 Other amnesia: Secondary | ICD-10-CM | POA: Diagnosis not present

## 2023-08-02 DIAGNOSIS — I2510 Atherosclerotic heart disease of native coronary artery without angina pectoris: Secondary | ICD-10-CM | POA: Diagnosis not present

## 2023-08-02 NOTE — Telephone Encounter (Signed)
He is established with Dr. Teofilo Pod team, she is just as experienced in this area I am. He should see her for this and if she cannot provide what he wants we can refer him for another opinion to an academic center for example thank you.

## 2023-08-04 ENCOUNTER — Telehealth: Payer: Self-pay

## 2023-08-04 DIAGNOSIS — G4733 Obstructive sleep apnea (adult) (pediatric): Secondary | ICD-10-CM

## 2023-08-04 NOTE — Telephone Encounter (Signed)
Patient called in to schedule his neurology appointment with Dr Frances Furbish. After that discussion, he stated that he had a home sleep study done this year, and after that, received a new mouthpiece to use while he is sleeping. He is wanting to have another HST to see if the mouthpiece is working.

## 2023-08-04 NOTE — Telephone Encounter (Signed)
I called pt.  He was a little confusing in what he wanted.  He asks about getting another HST, to see if mouth piece is working.  He wanted to reactivate machine, but then said he can turn it on himself,  he did not need supplies.  The oral device was given to him by Dr. Leeroy Bock (orthodontist).  I was not really able to understand what he specifically wanted.  He want to speak to you.

## 2023-08-05 NOTE — Telephone Encounter (Signed)
LMVM for pt to return call.  Held appt for 11-09-2022 at 1130 with MM/NP.

## 2023-08-05 NOTE — Telephone Encounter (Signed)
Patient called back and wanting to speak with [facility]. I offered to go ahead ad get him scheduled for the appt on hold for him, he declined and said that is not what he wants to do, would like to speak with someone. Pt would like a call back when available

## 2023-08-05 NOTE — Telephone Encounter (Signed)
I called pt back. He was able to explain to me the hx of his cpap , then oral device, HST, recorrection of mouth piece, and now asking for HST.  I relayed per Aundra Millet NP ok for HST, appt made 11-09-2022 at 1130. Order placed.

## 2023-08-06 ENCOUNTER — Encounter: Payer: Self-pay | Admitting: Cardiovascular Disease

## 2023-08-06 ENCOUNTER — Ambulatory Visit: Payer: Medicare Other | Attending: Cardiovascular Disease | Admitting: Cardiovascular Disease

## 2023-08-06 VITALS — BP 134/64 | HR 52 | Ht 70.0 in | Wt 169.0 lb

## 2023-08-06 DIAGNOSIS — I2510 Atherosclerotic heart disease of native coronary artery without angina pectoris: Secondary | ICD-10-CM | POA: Diagnosis not present

## 2023-08-06 DIAGNOSIS — I10 Essential (primary) hypertension: Secondary | ICD-10-CM | POA: Diagnosis not present

## 2023-08-06 DIAGNOSIS — E782 Mixed hyperlipidemia: Secondary | ICD-10-CM | POA: Insufficient documentation

## 2023-08-06 DIAGNOSIS — Z79899 Other long term (current) drug therapy: Secondary | ICD-10-CM | POA: Diagnosis not present

## 2023-08-06 MED ORDER — LOSARTAN POTASSIUM 50 MG PO TABS: 50.0000 mg | ORAL_TABLET | Freq: Every day | ORAL | 3 refills | Status: DC

## 2023-08-06 NOTE — Patient Instructions (Addendum)
Medication Instructions:  STOP Bisoprolol START Losartan 50mg  daily *If you need a refill on your cardiac medications before your next appointment, please call your pharmacy*  Lab Work: BMET, Lipids, ALT in 2-3 weeks If you have labs (blood work) drawn today and your tests are completely normal, you will receive your results only by: MyChart Message (if you have MyChart) OR A paper copy in the mail If you have any lab test that is abnormal or we need to change your treatment, we will call you to review the results.  Follow-Up: At [facility], you and your health needs are our priority.  As part of our continuing mission to provide you with exceptional heart care, we have created designated Provider Care Teams.  These Care Teams include your primary Cardiologist (physician) and Advanced Practice Providers (APPs -  Physician Assistants and Nurse Practitioners) who all work together to provide you with the care you need, when you need it.  We recommend signing up for the patient portal called "MyChart".  Sign up information is provided on this After Visit Summary.  MyChart is used to connect with patients for Virtual Visits (Telemedicine).  Patients are able to view lab/test results, encounter notes, upcoming appointments, etc.  Non-urgent messages can be sent to your provider as well.   To learn more about what you can do with MyChart, go to [email or web address].    Your next appointment:   1 year(s)  Provider:   Verne Carrow, MD
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
## 2023-08-21 DIAGNOSIS — R0982 Postnasal drip: Secondary | ICD-10-CM | POA: Diagnosis not present

## 2023-08-21 DIAGNOSIS — R0981 Nasal congestion: Secondary | ICD-10-CM | POA: Diagnosis not present

## 2023-08-26 DIAGNOSIS — E782 Mixed hyperlipidemia: Secondary | ICD-10-CM | POA: Diagnosis not present

## 2023-08-26 DIAGNOSIS — I2510 Atherosclerotic heart disease of native coronary artery without angina pectoris: Secondary | ICD-10-CM | POA: Diagnosis not present

## 2023-08-26 DIAGNOSIS — Z79899 Other long term (current) drug therapy: Secondary | ICD-10-CM | POA: Diagnosis not present

## 2023-08-26 DIAGNOSIS — I10 Essential (primary) hypertension: Secondary | ICD-10-CM | POA: Diagnosis not present

## 2023-08-26 LAB — LIPID PANEL
Chol/HDL Ratio: 2.3 {ratio} (ref 0.0–5.0)
Cholesterol, Total: 123 mg/dL (ref 100–199)
HDL: 54 mg/dL (ref >39)
LDL Chol Calc (NIH): 54 mg/dL (ref 0–99)
Triglycerides: 73 mg/dL (ref 0–149)
VLDL Cholesterol Cal: 15 mg/dL (ref 5–40)

## 2023-08-26 LAB — ALT: ALT: 21 [IU]/L (ref 0–44)

## 2023-08-26 LAB — BASIC METABOLIC PANEL
BUN/Creatinine Ratio: 20 (ref 10–24)
BUN: 23 mg/dL (ref 8–27)
CO2: 26 mmol/L (ref 20–29)
Calcium: 10.1 mg/dL (ref 8.6–10.2)
Chloride: 95 mmol/L — ABNORMAL LOW (ref 96–106)
Creatinine, Ser: 1.16 mg/dL (ref 0.76–1.27)
Glucose: 99 mg/dL (ref 70–99)
Potassium: 5.3 mmol/L — ABNORMAL HIGH (ref 3.5–5.2)
Sodium: 136 mmol/L (ref 134–144)
eGFR: 64 mL/min/{1.73_m2} (ref >59)

## 2023-08-30 ENCOUNTER — Telehealth: Payer: Self-pay

## 2023-08-30 DIAGNOSIS — J209 Acute bronchitis, unspecified: Secondary | ICD-10-CM | POA: Diagnosis not present

## 2023-08-30 NOTE — Telephone Encounter (Signed)
-----   Message from Kristeen Miss sent at 08/28/2023  8:49 AM EST ----- Potassium is slightly elevated ( after starting Losartan) Is he eating more foods that are high in potassium ? If so, I would like for him to reduce his intake of these items. Recheck BMP in 2-3 weeks.

## 2023-08-30 NOTE — Telephone Encounter (Signed)
Called and spoke to patient who denies any increase in potassium-rich foods. He does eat foods high in potassium, but no changes to his diet. He states that the losartan is making his hair fall out and he wants to "find something else to take." Says that he has been having issues with alopecia for a while and prior to starting this medication he had to clean his shower drain every third shower, but now, his drain is "full of hair after every bath." He has not BP readings to provide, but states he does check it at home. Verified that he's taking the Amlodipine 5mg  daily, but he has stopped the Maxzide stating "another doctor told me to stop it."  Repeat BMET has not been ordered. Will route to Nahser on alternate BP medication.

## 2023-08-31 NOTE — Telephone Encounter (Signed)
I don't think his hair loss is from the losartan, but given his potassium, I would stop the losartan. It was probably the hydrochlorothiazide that was keeping his K down and now that he is off of it.. Would increase amlodipine to 10mg .

## 2023-09-02 MED ORDER — AMLODIPINE BESYLATE 10 MG PO TABS: 10.0000 mg | ORAL_TABLET | Freq: Every day | ORAL | 3 refills | Status: DC

## 2023-09-02 NOTE — Telephone Encounter (Signed)
Nahser, Deloris Ping, MD sent to Olene Floss, RPH-CPP; Lars Mage, RN Caller: Unspecified (3 days ago,  2:37 PM) I'll go along with recs from Malena Peer, [facility]   Re stopping losartan and changing to amlodipine PN  Attempted to reach patient, but had to leave voicemail asking that he call office back.

## 2023-09-02 NOTE — Telephone Encounter (Signed)
Patient called back into office and verbalized understanding. He will stop losartan. States he just filled his amlodipine and will double with the ones he has. New prescription sent to pharmacy.

## 2023-09-21 ENCOUNTER — Telehealth: Payer: Self-pay | Admitting: Cardiovascular Disease

## 2023-09-21 NOTE — Telephone Encounter (Signed)
 Pt c/o BP issue: STAT if pt c/o blurred vision, one-sided weakness or slurred speech  1. What are your last 5 BP readings?  Ranging around 160's-80's (he stated he did not write down an specific readings)   2. Are you having any other symptoms (ex. Dizziness, headache, blurred vision, passed out)? No   3. What is your BP issue? Pt called in stating since starting amlodipine , his top number has still been high and he is concerned.

## 2023-09-22 NOTE — Telephone Encounter (Signed)
 Returned call to patient to verify he is taking the Amlodipine 10mg  daily, no answer. Will await call back.   History: -Pt seen 08/06/23, started Losartan 50mg  daily -C/o hair loss w/Losartan & had elevated K, changed to Amlodipine 10mg 

## 2023-09-28 DIAGNOSIS — G4733 Obstructive sleep apnea (adult) (pediatric): Secondary | ICD-10-CM | POA: Diagnosis not present

## 2023-09-28 DIAGNOSIS — I10 Essential (primary) hypertension: Secondary | ICD-10-CM | POA: Diagnosis not present

## 2023-09-28 DIAGNOSIS — R251 Tremor, unspecified: Secondary | ICD-10-CM | POA: Diagnosis not present

## 2023-09-28 DIAGNOSIS — R413 Other amnesia: Secondary | ICD-10-CM | POA: Diagnosis not present

## 2023-09-28 DIAGNOSIS — Z1389 Encounter for screening for other disorder: Secondary | ICD-10-CM | POA: Diagnosis not present

## 2023-09-28 DIAGNOSIS — R2681 Unsteadiness on feet: Secondary | ICD-10-CM | POA: Diagnosis not present

## 2023-09-28 DIAGNOSIS — I2510 Atherosclerotic heart disease of native coronary artery without angina pectoris: Secondary | ICD-10-CM | POA: Diagnosis not present

## 2023-10-03 ENCOUNTER — Encounter (HOSPITAL_COMMUNITY): Payer: Self-pay

## 2023-10-03 ENCOUNTER — Emergency Department (HOSPITAL_COMMUNITY): Payer: Medicare Other

## 2023-10-03 ENCOUNTER — Other Ambulatory Visit: Payer: Self-pay

## 2023-10-03 ENCOUNTER — Emergency Department (HOSPITAL_COMMUNITY)
Admission: EM | Admit: 2023-10-03 | Discharge: 2023-10-04 | Disposition: A | Discharge status: Home / Self Care | Payer: Medicare Other | Attending: Emergency Medicine | Admitting: Emergency Medicine

## 2023-10-03 DIAGNOSIS — N200 Calculus of kidney: Secondary | ICD-10-CM | POA: Diagnosis not present

## 2023-10-03 DIAGNOSIS — N139 Obstructive and reflux uropathy, unspecified: Secondary | ICD-10-CM | POA: Diagnosis not present

## 2023-10-03 DIAGNOSIS — R7989 Other specified abnormal findings of blood chemistry: Secondary | ICD-10-CM | POA: Diagnosis not present

## 2023-10-03 DIAGNOSIS — Z79899 Other long term (current) drug therapy: Secondary | ICD-10-CM | POA: Diagnosis not present

## 2023-10-03 DIAGNOSIS — K5900 Constipation, unspecified: Secondary | ICD-10-CM | POA: Insufficient documentation

## 2023-10-03 DIAGNOSIS — I10 Essential (primary) hypertension: Secondary | ICD-10-CM | POA: Diagnosis not present

## 2023-10-03 DIAGNOSIS — K4090 Unilateral inguinal hernia, without obstruction or gangrene, not specified as recurrent: Secondary | ICD-10-CM | POA: Diagnosis not present

## 2023-10-03 DIAGNOSIS — Z7982 Long term (current) use of aspirin: Secondary | ICD-10-CM | POA: Insufficient documentation

## 2023-10-03 DIAGNOSIS — N202 Calculus of kidney with calculus of ureter: Secondary | ICD-10-CM | POA: Diagnosis not present

## 2023-10-03 LAB — BASIC METABOLIC PANEL
Anion gap: 14 (ref 5–15)
BUN: 15 mg/dL (ref 8–23)
CO2: 24 mmol/L (ref 22–32)
Calcium: 9.5 mg/dL (ref 8.9–10.3)
Chloride: 92 mmol/L — ABNORMAL LOW (ref 98–111)
Creatinine, Ser: 1.88 mg/dL — ABNORMAL HIGH (ref 0.61–1.24)
GFR, Estimated: 36 mL/min — ABNORMAL LOW (ref >60)
Glucose, Bld: 101 mg/dL — ABNORMAL HIGH (ref 70–99)
Potassium: 3.9 mmol/L (ref 3.5–5.1)
Sodium: 130 mmol/L — ABNORMAL LOW (ref 135–145)

## 2023-10-03 LAB — CBC
HCT: 40.9 % (ref 39.0–52.0)
Hemoglobin: 14.1 g/dL (ref 13.0–17.0)
MCH: 33 pg (ref 26.0–34.0)
MCHC: 34.5 g/dL (ref 30.0–36.0)
MCV: 95.8 fL (ref 80.0–100.0)
Platelets: 217 10*3/uL (ref 150–400)
RBC: 4.27 MIL/uL (ref 4.22–5.81)
RDW: 12.5 % (ref 11.5–15.5)
WBC: 8.3 10*3/uL (ref 4.0–10.5)
nRBC: 0 % (ref 0.0–0.2)

## 2023-10-03 MED ORDER — ONDANSETRON 4 MG PO TBDP
4.0000 mg | ORAL_TABLET | Freq: Three times a day (TID) | ORAL | 0 refills | Status: AC | PRN
Start: 2023-10-03 — End: 2023-10-17

## 2023-10-03 MED ORDER — FLEET ENEMA RE ENEM
1.0000 | ENEMA | Freq: Once | RECTAL | Status: AC
Start: 2023-10-03 — End: 2023-10-03
  Administered 2023-10-03: 1 via RECTAL
  Filled 2023-10-03: qty 1

## 2023-10-03 MED ORDER — IOHEXOL 350 MG/ML SOLN
75.0000 mL | Freq: Once | INTRAVENOUS | Status: AC | PRN
  Administered 2023-10-03: 75 mL via INTRAVENOUS

## 2023-10-03 MED ORDER — OXYCODONE HCL 5 MG PO TABS: 5.0000 mg | ORAL_TABLET | Freq: Three times a day (TID) | ORAL | 0 refills | Status: AC

## 2023-10-03 MED ORDER — SODIUM CHLORIDE 0.9 % IV BOLUS
1000.0000 mL | Freq: Once | INTRAVENOUS | Status: AC
  Administered 2023-10-03: 1000 mL via INTRAVENOUS

## 2023-10-03 MED ORDER — TAMSULOSIN HCL 0.4 MG PO CAPS: 0.4000 mg | ORAL_CAPSULE | Freq: Every day | ORAL | 0 refills | Status: AC

## 2023-10-03 MED ORDER — DICYCLOMINE HCL 10 MG/ML IM SOLN
20.0000 mg | Freq: Once | INTRAMUSCULAR | Status: AC
  Administered 2023-10-03: 20 mg via INTRAMUSCULAR
  Filled 2023-10-03: qty 2

## 2023-10-03 MED ORDER — FLEET ENEMA RE ENEM
1.0000 | ENEMA | Freq: Every day | RECTAL | 0 refills | Status: AC | PRN
Start: 2023-10-03 — End: 2023-10-08

## 2023-10-03 NOTE — ED Notes (Signed)
Patient supplied with fleet enema and bedside commode. Patient given instructions for enema and stated "he can perform enema without assistance" Wife at bedside agrees with patient that he will be able to administer enema with out assistance.

## 2023-10-03 NOTE — ED Triage Notes (Signed)
Pt presents with c/o constipation with LBM on 1/16. Pt states he is taking stool softeners, miralax, and epsom salt. Pt denies abd pain, N/V. Pt recent medication changes are amlodipine and primidone.

## 2023-10-03 NOTE — ED Provider Notes (Signed)
[facility]MENT AT [facility] Provider Note   CSN: [id number] Arrival date & time: 10/03/23  1505     History  Chief Complaint  Patient presents with   Constipation    Tyler Reid is a 78 y.o. male with a history of hypertension, insomnia, and constipation who presents the ED today for constipation.  Patient reports that he takes MiraLAX and stool softeners regularly but has not had a bowel movement in the past 3 days.  He has tried an Epsom salt enema at home, which usually work, without any improvement not this time.  He has been having intermittent abdominal and back pains. At the time of evaluation, he endorses lower abdominal pain but denies fevers, nausea, vomiting, or inability to pass gas. Denies dysuria or hematuria. He is urinating normally. No additional complaints or concerns at this time.    Home Medications Prior to Admission medications   Medication Sig Start Date End Date Taking? Authorizing Provider  primidone (MYSOLINE) 50 MG tablet Take 50 mg by mouth at bedtime.   Yes [provider]  amLODipine (NORVASC) 10 MG tablet Take 1 tablet (10 mg total) by mouth daily. 09/02/23   Nahser, Deloris Ping, MD  ASPIRIN LOW DOSE 81 MG EC tablet Take 81 mg by mouth daily. 10/10/18   [provider]  augmented betamethasone dipropionate (DIPROLENE-AF) 0.05 % cream Apply topically 2 (two) times daily as needed. 01/26/23   [provider]  Bempedoic Acid-Ezetimibe (NEXLIZET) 180-10 MG TABS Take 1 tablet by mouth daily. 05/28/23   Nahser, Deloris Ping, MD  Cholecalciferol (VITAMIN D) 125 MCG (5000 UT) CAPS Take by mouth. One daily    [provider]  finasteride (PROPECIA) 1 MG tablet Take one tablet by mouth daily Oral 10/24/19   [provider]  gabapentin (NEURONTIN) 100 MG capsule Take 200 mg by mouth in the morning and at bedtime.    [provider]  metroNIDAZOLE (METROGEL) 0.75 % gel Apply thin layer once daily  throughout the face. 10/08/22   [provider]  nitroGLYCERIN (NITROSTAT) 0.4 MG SL tablet take 1 tablet under the tongue as needed for chest pain Sublingual 10/10/18   [provider]  omeprazole (PRILOSEC) 20 MG capsule TAKE 1 CAPSULE BY MOUTH EVERYDAY AT BEDTIME Oral 07/26/19   [provider]  polyethylene glycol (MIRALAX / GLYCOLAX) 17 g packet Take 17 g by mouth daily.    [provider]  RESTASIS 0.05 % ophthalmic emulsion Place 1 drop into both eyes 2 (two) times daily. 04/04/20   [provider]      Allergies    Bupropion, Evolocumab, Niacin and related, Sertraline, Statins, Trazodone hcl, and Venlafaxine    Review of Systems   Review of Systems  Gastrointestinal:  Positive for constipation.  All other systems reviewed and are negative.   Physical Exam Updated Vital Signs BP (!) 147/89   Pulse 94   Temp 98.4 F (36.9 C)   Resp 16   Ht 5\' 10"  (1.778 m)   Wt 74.8 kg   SpO2 100%   BMI 23.68 kg/m  Physical Exam Vitals and nursing note reviewed.  Constitutional:      General: He is not in acute distress.    Appearance: Normal appearance.  HENT:     Head: Normocephalic and atraumatic.     Mouth/Throat:     Mouth: Mucous membranes are moist.  Eyes:     Conjunctiva/sclera: Conjunctivae normal.  Pupils: Pupils are equal, round, and reactive to light.  Cardiovascular:     Rate and Rhythm: Normal rate and regular rhythm.     Pulses: Normal pulses.     Heart sounds: Normal heart sounds.  Pulmonary:     Effort: Pulmonary effort is normal.     Breath sounds: Normal breath sounds.  Abdominal:     Palpations: Abdomen is soft.     Tenderness: There is abdominal tenderness. There is no right CVA tenderness, left CVA tenderness, guarding or rebound.     Comments: Suprapubic tenderness to palpation  Musculoskeletal:        General: Normal range of motion.     Cervical back: Normal range of motion.  Skin:    General: Skin is  warm and dry.     Findings: No rash.  Neurological:     General: No focal deficit present.     Mental Status: He is alert.  Psychiatric:        Mood and Affect: Mood normal.        Behavior: Behavior normal.    ED Results / Procedures / Treatments   Labs (all labs ordered are listed, but only abnormal results are displayed) Labs Reviewed  BASIC METABOLIC PANEL - Abnormal; Notable for the following components:      Result Value   Sodium 130 (*)    Chloride 92 (*)    Glucose, Bld 101 (*)    Creatinine, Ser 1.88 (*)    GFR, Estimated 36 (*)    All other components within normal limits  CBC  URINALYSIS, ROUTINE W REFLEX MICROSCOPIC    EKG None  Radiology CT ABDOMEN PELVIS W CONTRAST Result Date: 10/03/2023 CLINICAL DATA:  Left lower quadrant abdominal pain. EXAM: CT ABDOMEN AND PELVIS WITH CONTRAST TECHNIQUE: Multidetector CT imaging of the abdomen and pelvis was performed using the standard protocol following bolus administration of intravenous contrast. RADIATION DOSE REDUCTION: This exam was performed according to the departmental dose-optimization program which includes automated exposure control, adjustment of the mA and/or kV according to patient size and/or use of iterative reconstruction technique. CONTRAST:  75mL OMNIPAQUE IOHEXOL 350 MG/ML SOLN COMPARISON:  03/25/2015. FINDINGS: Lower chest: Coronary artery calcifications are noted. Atelectasis is present at the lung bases. Hepatobiliary: A subcentimeter hypodensity is present in the right lobe of the liver is too small to further characterize. The gallbladder is without stones. No biliary ductal dilatation. Pancreas: Unremarkable. No pancreatic ductal dilatation or surrounding inflammatory changes. Spleen: Normal in size without focal abnormality. Adrenals/Urinary Tract: The adrenal glands are within normal limits. There is a delayed nephrogram on the right. Cysts are present in the kidneys bilaterally. A 1.5 cm complex  hypodensity is noted in the mid left kidney with attenuation of 100 Hounsfield units, likely hemorrhagic or proteinaceous cyst. Additional subcentimeter hypodensities are noted which are too small to further characterize. There is bilateral nephrolithiasis. Mild obstructive uropathy is noted on the right with a 6 mm calculus in the proximal right ureter at the UPJ. No ureteral calculus or obstructive uropathy on the left. The bladder is unremarkable. Stomach/Bowel: Stomach is within normal limits. Appendix appears normal. No evidence of bowel wall thickening, distention, or inflammatory changes. No free air or pneumatosis is seen. A moderate amount of retained stool is present in the colon. Vascular/Lymphatic: Aortic atherosclerosis. No enlarged abdominal or pelvic lymph nodes. Reproductive: Prostate gland is mildly enlarged. Other: No abdominopelvic ascites. A small fat containing umbilical hernia is noted. A fat containing  left inguinal hernia is present. Musculoskeletal: Degenerative changes are present in the thoracolumbar spine. No acute osseous abnormality is seen. IMPRESSION: 1. Mild obstructive uropathy on the right with a 6 mm calculus in the proximal right ureter at the ureteropelvic junction. 2. Bilateral nephrolithiasis. 3. Moderate amount of retained stool in the colon suggesting constipation. 4. Fat containing left inguinal hernia. 5. Enlarged prostate gland. 6. Coronary artery calcifications. 7. Aortic atherosclerosis. Electronically Signed   By: Thornell Sartorius M.D.   On: 10/03/2023 22:26    Procedures Procedures: not indicated.   Medications Ordered in ED Medications  sodium chloride 0.9 % bolus 1,000 mL (0 mLs Intravenous Stopped 10/03/23 2155)  dicyclomine (BENTYL) injection 20 mg (20 mg Intramuscular Given 10/03/23 2220)  sodium phosphate (FLEET) enema 1 enema (1 enema Rectal Given 10/03/23 2224)  iohexol (OMNIPAQUE) 350 MG/ML injection 75 mL (75 mLs Intravenous Contrast Given 10/03/23  2210)    ED Course/ Medical Decision Making/ A&P                                 Medical Decision Making Amount and/or Complexity of Data Reviewed Labs: ordered. Radiology: ordered.  Risk Prescription drug management.   This patient presents to the ED for concern of constipation, this involves an extensive number of treatment options, and is a complaint that carries with it a high risk of complications and morbidity.   Differential diagnosis includes: IBS, IBD, bowel obstruction, drug-induced constipation, peristalsis, etc.   Comorbidities  See HPI above   Additional History  Additional history obtained from prior records.   Lab Tests  I ordered and personally interpreted labs.  The pertinent results include:   Creatinine of 1.88, GFR of 36 otherwise BMP and CBC are within normal limits UA pending at shift change   Imaging Studies  I ordered imaging studies including CT abdomen/pelvis with contrast  I independently visualized and interpreted imaging which showed:  Mild obstructive uropathy on the right with a 6 mm calculus in the proximal right ureter at the ureteropelvic junction. Moderate amount of retained stool Fat containing left inguinal hernia Enlarged prostate gland I agree with the radiologist interpretation   Problem List / ED Course / Critical Interventions / Medication Management  Constipation x3 days despite magnesium sulfate enema and regular Miralax use. Endorses some associated intermittent low back and lower abdominal pain. I ordered medications including: 1L NS for elevated creatinine Bentyl for abdominal pain Fleet enema for constipation  I have reviewed the patients home medicines and have made adjustments as needed   Social Determinants of Health  Access to healthcare   Test / Admission - Considered  Patient care signed out to oncoming PA, [employer], at shift change.        Final Clinical Impression(s) / ED  Diagnoses Final diagnoses:  Kidney stone  Constipation, unspecified constipation type    Rx / DC Orders ED Discharge Orders     None         Maxwell Marion, PA-C 10/03/23 2357    Melene Plan, DO 10/04/23 2256

## 2023-10-03 NOTE — Discharge Instructions (Addendum)
Take 8 scoops of miralax in 32oz of whatever you would like to drink. (Gatorade comes in this size) You can also use a fleets enema which you can buy over the counter at the pharmacy.    You also have a right sided kidney stone. This should pass on its own in 7-14 days. Take Flomax once a day to help passage of the stone. Alternate between Ibuprofen and Tylenol every 4 hours for pain relief. Take Oxycodone as needed for breakthrough pain as well as Zofran for nausea/vomiting. Please follow-up with the urologist.  You need to have your kidney function rechecked in a week to ensure that it is improving.  Oxycodone is a pain medication and may cause drowsiness. Do not drive or operate heavy machinery while taking this medication.  I have provided information for urology to follow up with for reevaluation of the kidney stone.  Get help right away if: You have a fever and your symptoms suddenly get worse. You leak stool or have blood in your stool. Your abdomen is bloated. You have severe pain in your abdomen. You feel dizzy or you faint.

## 2023-10-04 LAB — URINALYSIS, ROUTINE W REFLEX MICROSCOPIC
Bacteria, UA: NONE SEEN
Bilirubin Urine: NEGATIVE
Glucose, UA: NEGATIVE mg/dL
Ketones, ur: 20 mg/dL — AB
Leukocytes,Ua: NEGATIVE
Nitrite: NEGATIVE
Protein, ur: NEGATIVE mg/dL
Specific Gravity, Urine: 1.031 — ABNORMAL HIGH (ref 1.005–1.030)
pH: 5 (ref 5.0–8.0)

## 2023-10-04 NOTE — ED Provider Notes (Signed)
Patient may evaluated by me.  He is feeling well.  States that he does not need any additional pain medication.  I discussed his lab results, including his elevated creatinine.  I have advised him to have this rechecked in 1 week by either his doctor or the urologist.  Prescription sent to pharmacy by prior provider.  Patient advised to follow-up with urology and PCP.  He is feeling improved.  He has received 1 L of fluid.  Will discharge home.  Return precautions discussed.  Discussed labs and plan with Dr. Wilkie Aye, who agrees with plan for close follow-up.   Roxy Horseman, PA-C 10/04/23 [id number]    Shon Baton, MD 10/04/23 [phone]

## 2023-10-15 NOTE — Telephone Encounter (Signed)
Pt calls back in to provide his BP log. States that the Amlodipine 10mg  is not working and by end of day he is having a headache. He is seeing PCP in one week and wants to discuss this with him. Reports BP is ranging from 149/63-155/84. He is going to continue Amlodipine 10mg  daily and add back half tablet(25mg ) of losartan until he sees his primary in one week.

## 2023-10-18 DIAGNOSIS — I2510 Atherosclerotic heart disease of native coronary artery without angina pectoris: Secondary | ICD-10-CM | POA: Diagnosis not present

## 2023-10-18 DIAGNOSIS — Z125 Encounter for screening for malignant neoplasm of prostate: Secondary | ICD-10-CM | POA: Diagnosis not present

## 2023-10-18 DIAGNOSIS — D649 Anemia, unspecified: Secondary | ICD-10-CM | POA: Diagnosis not present

## 2023-10-18 DIAGNOSIS — I10 Essential (primary) hypertension: Secondary | ICD-10-CM | POA: Diagnosis not present

## 2023-10-18 DIAGNOSIS — Z1212 Encounter for screening for malignant neoplasm of rectum: Secondary | ICD-10-CM | POA: Diagnosis not present

## 2023-10-18 DIAGNOSIS — E782 Mixed hyperlipidemia: Secondary | ICD-10-CM | POA: Diagnosis not present

## 2023-10-18 DIAGNOSIS — E291 Testicular hypofunction: Secondary | ICD-10-CM | POA: Diagnosis not present

## 2023-10-19 DIAGNOSIS — D2271 Melanocytic nevi of right lower limb, including hip: Secondary | ICD-10-CM | POA: Diagnosis not present

## 2023-10-19 DIAGNOSIS — D225 Melanocytic nevi of trunk: Secondary | ICD-10-CM | POA: Diagnosis not present

## 2023-10-19 DIAGNOSIS — B353 Tinea pedis: Secondary | ICD-10-CM | POA: Diagnosis not present

## 2023-10-19 DIAGNOSIS — D485 Neoplasm of uncertain behavior of skin: Secondary | ICD-10-CM | POA: Diagnosis not present

## 2023-10-19 DIAGNOSIS — Z1283 Encounter for screening for malignant neoplasm of skin: Secondary | ICD-10-CM | POA: Diagnosis not present

## 2023-10-22 DIAGNOSIS — Z1212 Encounter for screening for malignant neoplasm of rectum: Secondary | ICD-10-CM | POA: Diagnosis not present

## 2023-10-22 DIAGNOSIS — D649 Anemia, unspecified: Secondary | ICD-10-CM | POA: Diagnosis not present

## 2023-10-22 DIAGNOSIS — E291 Testicular hypofunction: Secondary | ICD-10-CM | POA: Diagnosis not present

## 2023-10-25 DIAGNOSIS — Z0000 Encounter for general adult medical examination without abnormal findings: Secondary | ICD-10-CM | POA: Diagnosis not present

## 2023-10-25 DIAGNOSIS — T466X5A Adverse effect of antihyperlipidemic and antiarteriosclerotic drugs, initial encounter: Secondary | ICD-10-CM | POA: Diagnosis not present

## 2023-10-25 DIAGNOSIS — I2510 Atherosclerotic heart disease of native coronary artery without angina pectoris: Secondary | ICD-10-CM | POA: Diagnosis not present

## 2023-10-25 DIAGNOSIS — G2581 Restless legs syndrome: Secondary | ICD-10-CM | POA: Diagnosis not present

## 2023-10-25 DIAGNOSIS — R413 Other amnesia: Secondary | ICD-10-CM | POA: Diagnosis not present

## 2023-10-25 DIAGNOSIS — R82998 Other abnormal findings in urine: Secondary | ICD-10-CM | POA: Diagnosis not present

## 2023-10-25 DIAGNOSIS — R42 Dizziness and giddiness: Secondary | ICD-10-CM | POA: Diagnosis not present

## 2023-10-25 DIAGNOSIS — I10 Essential (primary) hypertension: Secondary | ICD-10-CM | POA: Diagnosis not present

## 2023-10-25 DIAGNOSIS — I700 Atherosclerosis of aorta: Secondary | ICD-10-CM | POA: Diagnosis not present

## 2023-10-25 DIAGNOSIS — G4733 Obstructive sleep apnea (adult) (pediatric): Secondary | ICD-10-CM | POA: Diagnosis not present

## 2023-11-10 ENCOUNTER — Encounter: Payer: Medicare Other | Admitting: Adult Health

## 2023-11-15 ENCOUNTER — Encounter: Payer: Self-pay | Admitting: Neurology

## 2023-11-15 ENCOUNTER — Ambulatory Visit (INDEPENDENT_AMBULATORY_CARE_PROVIDER_SITE_OTHER): Payer: Medicare Other | Admitting: Neurology

## 2023-11-15 VITALS — BP 139/69 | HR 61 | Ht 70.0 in | Wt 163.0 lb

## 2023-11-15 DIAGNOSIS — R413 Other amnesia: Secondary | ICD-10-CM | POA: Diagnosis not present

## 2023-11-15 DIAGNOSIS — Z818 Family history of other mental and behavioral disorders: Secondary | ICD-10-CM | POA: Diagnosis not present

## 2023-11-15 DIAGNOSIS — R9089 Other abnormal findings on diagnostic imaging of central nervous system: Secondary | ICD-10-CM

## 2023-11-15 DIAGNOSIS — R251 Tremor, unspecified: Secondary | ICD-10-CM | POA: Diagnosis not present

## 2023-11-15 DIAGNOSIS — G309 Alzheimer's disease, unspecified: Secondary | ICD-10-CM

## 2023-11-15 DIAGNOSIS — R4789 Other speech disturbances: Secondary | ICD-10-CM | POA: Diagnosis not present

## 2023-11-15 NOTE — Patient Instructions (Signed)
 It was nice to see you today.  You have complaints of memory loss: memory loss or changes in cognitive function can have many reasons and does not always mean you have dementia.  There are several conditions and situations that can contribute to subjective or objective memory loss.  These factors include: depression, stress, sleep deprivation or poor sleep from insomnia or sleep apnea, dehydration, fluctuation in blood sugar values, thyroid or electrolyte dysfunction, medication effects from sedating medications or narcotic pain medication for example and certain vitamin deficiencies such as vitamin B12 deficiency, and anemia. Dementia can be caused by stroke, brain atherosclerosis or brain vascular disease due to vascular risk factors (smoking, high blood pressure, high cholesterol, obesity and uncontrolled diabetes), certain degenerative brain disorders (including Parkinson's disease and Multiple sclerosis) and by Alzheimer's disease or other, more rare and sometimes hereditary causes.   Here is what I would recommend:   blood work (which we will do today).  We may consider another scan called a brain PET scan to help with diagnosis.  We will request a formal cognitive test called neuropsychological evaluation which is done by a licensed neuropsychologist. We will make a referral in that regard.  You can continue with your current medications.   We will keep you posted as to your test results by phone call for now.  We will plan a follow-up after testing.
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
## 2023-11-16 ENCOUNTER — Telehealth: Payer: Self-pay | Admitting: Neurology

## 2023-11-16 DIAGNOSIS — H35073 Retinal telangiectasis, bilateral: Secondary | ICD-10-CM | POA: Diagnosis not present

## 2023-11-16 NOTE — Telephone Encounter (Signed)
 Referral faxed to [facility] (fax# [phone], phone# [phone])

## 2023-11-17 ENCOUNTER — Telehealth: Payer: Self-pay | Admitting: Neurology

## 2023-11-17 NOTE — Telephone Encounter (Signed)
 Pt has returned call to [place], [place]

## 2023-11-17 NOTE — Telephone Encounter (Addendum)
 I called pt and relayed the results of the labs, showing mild anemia, significant difference from a month ago.  Will forward to Dr. Eloise Harman (pcp) but wanted you as well to know to contact him as well relating to discussing anemia, potential blood loss, and any work up if needed.  He verbalized understanding.

## 2023-11-17 NOTE — Telephone Encounter (Signed)
-----   Message from [name] sent at 11/16/2023  7:41 AM EST ----- Not all labs are back, but his CBC shows mild anemia, with significant difference from about a month ago.  I recommend that he follow-up with PCP soon to discuss anemia, potential blood loss, further workup, etc.

## 2023-11-17 NOTE — Telephone Encounter (Signed)
I called pt and LMVM for pt to return call.

## 2023-11-17 NOTE — Telephone Encounter (Signed)
 Faxed to Dr. Eloise Harman [phone].

## 2023-11-17 NOTE — Telephone Encounter (Signed)
 Pt returning call to [place], [place] for results

## 2023-11-20 LAB — APOE ALZHEIMER'S RISK

## 2023-11-20 LAB — COMPREHENSIVE METABOLIC PANEL
ALT: 24 IU/L (ref 0–44)
AST: 40 IU/L (ref 0–40)
Albumin: 4.6 g/dL (ref 3.8–4.8)
Alkaline Phosphatase: 44 IU/L (ref 44–121)
BUN/Creatinine Ratio: 11 (ref 10–24)
BUN: 12 mg/dL (ref 8–27)
Bilirubin Total: 0.6 mg/dL (ref 0.0–1.2)
CO2: 27 mmol/L (ref 20–29)
Calcium: 9.9 mg/dL (ref 8.6–10.2)
Chloride: 94 mmol/L — ABNORMAL LOW (ref 96–106)
Creatinine, Ser: 1.05 mg/dL (ref 0.76–1.27)
Globulin, Total: 2.1 g/dL (ref 1.5–4.5)
Glucose: 88 mg/dL (ref 70–99)
Potassium: 4.1 mmol/L (ref 3.5–5.2)
Sodium: 134 mmol/L (ref 134–144)
Total Protein: 6.7 g/dL (ref 6.0–8.5)
eGFR: 73 mL/min/{1.73_m2} (ref >59)

## 2023-11-20 LAB — CBC WITH DIFFERENTIAL/PLATELET
Basophils Absolute: 0 10*3/uL (ref 0.0–0.2)
Basos: 1 %
EOS (ABSOLUTE): 0.1 10*3/uL (ref 0.0–0.4)
Eos: 1 %
Hematocrit: 37.1 % — ABNORMAL LOW (ref 37.5–51.0)
Hemoglobin: 12.6 g/dL — ABNORMAL LOW (ref 13.0–17.7)
Immature Grans (Abs): 0 10*3/uL (ref 0.0–0.1)
Immature Granulocytes: 0 %
Lymphocytes Absolute: 1.3 10*3/uL (ref 0.7–3.1)
Lymphs: 33 %
MCH: 32.7 pg (ref 26.6–33.0)
MCHC: 34 g/dL (ref 31.5–35.7)
MCV: 96 fL (ref 79–97)
Monocytes Absolute: 0.3 10*3/uL (ref 0.1–0.9)
Monocytes: 8 %
Neutrophils Absolute: 2.3 10*3/uL (ref 1.4–7.0)
Neutrophils: 57 %
Platelets: 234 10*3/uL (ref 150–450)
RBC: 3.85 x10E6/uL — ABNORMAL LOW (ref 4.14–5.80)
RDW: 12.1 % (ref 11.6–15.4)
WBC: 4 10*3/uL (ref 3.4–10.8)

## 2023-11-20 LAB — ATN PROFILE
A -- Beta-amyloid 42/40 Ratio: 0.1 — ABNORMAL LOW (ref >0.102)
Beta-amyloid 40: 214.86 pg/mL
Beta-amyloid 42: 21.51 pg/mL
N -- NfL, Plasma: 7.93 pg/mL — ABNORMAL HIGH (ref 0.00–7.64)
T -- p-tau181: 3.97 pg/mL — ABNORMAL HIGH (ref 0.00–0.97)

## 2023-11-20 LAB — TSH: TSH: 0.548 u[IU]/mL (ref 0.450–4.500)

## 2023-11-20 LAB — B12 AND FOLATE PANEL
Folate: 9.4 ng/mL (ref >3.0)
Vitamin B-12: 427 pg/mL (ref 232–1245)

## 2023-11-20 LAB — HGB A1C W/O EAG: Hgb A1c MFr Bld: 5.3 % (ref 4.8–5.6)

## 2023-11-29 ENCOUNTER — Telehealth: Payer: Self-pay | Admitting: *Deleted

## 2023-11-29 NOTE — Telephone Encounter (Signed)
 I placed an order for brain PET scan, metabolic.

## 2023-11-29 NOTE — Telephone Encounter (Signed)
 I called pt. Gave him the latest results ALZ marker (one of the AL markers did show an increased risk for ALZ dementia. He would like to proceed with the brain PET scan. I did mention about the pcp, Dr. Eloise Harman, seeing him for the lab results anemia. He did contact them but they said did not receive. I told him I did fax, will call and make sure about fax #.  And (it is correct what I had previously).  I will fax to them again. Levonne Spiller, Dr. Silvano Rusk nurse.  Pt verbalized understanding.

## 2023-11-29 NOTE — Telephone Encounter (Signed)
-----   Message from Tyler Reid sent at 11/23/2023  8:36 AM EDT ----- Please call patient and advise him that one of the Alzheimer's markers tests show an increased risk for Alzheimer's dementia.  I had previously advised him to make a follow-up appointment with his primary care regarding his anemia.  Please inquire if he has made an appointment yet.  As discussed, we can proceed with a specialized brain scan called a brain PET scan which can help [place] narrow down the diagnosis when it comes to memory loss and dementia.  Let me know if he would like to go ahead with a brain PET scan.

## 2023-11-29 NOTE — Addendum Note (Signed)
 Addended by: [name] on: 11/29/2023 12:15 PM   Modules accepted: Orders

## 2023-11-30 DIAGNOSIS — R413 Other amnesia: Secondary | ICD-10-CM | POA: Diagnosis not present

## 2023-11-30 DIAGNOSIS — R9089 Other abnormal findings on diagnostic imaging of central nervous system: Secondary | ICD-10-CM | POA: Diagnosis not present

## 2023-11-30 DIAGNOSIS — R634 Abnormal weight loss: Secondary | ICD-10-CM | POA: Diagnosis not present

## 2023-11-30 DIAGNOSIS — D649 Anemia, unspecified: Secondary | ICD-10-CM | POA: Diagnosis not present

## 2023-11-30 NOTE — Telephone Encounter (Signed)
 no auth required sent to [facility] [phone]

## 2023-12-03 DIAGNOSIS — D649 Anemia, unspecified: Secondary | ICD-10-CM | POA: Diagnosis not present

## 2023-12-06 ENCOUNTER — Telehealth: Payer: Self-pay | Admitting: Neurology

## 2023-12-06 ENCOUNTER — Telehealth: Payer: Self-pay | Admitting: Gastroenterology

## 2023-12-06 NOTE — Telephone Encounter (Signed)
 Noted.

## 2023-12-06 NOTE — Addendum Note (Signed)
 Addended by: [name] on: 12/06/2023 04:06 PM   Modules accepted: Orders

## 2023-12-06 NOTE — Telephone Encounter (Signed)
 Urgent referral in [place] for anemia.  Patient said he is also having weight loss.  Please review and advise urgency and scheduling.  Thanks

## 2023-12-06 NOTE — Telephone Encounter (Signed)
 Please cancel PET metabolic. I would like to order a brain amyloid PET scan.

## 2023-12-07 ENCOUNTER — Other Ambulatory Visit

## 2023-12-07 ENCOUNTER — Ambulatory Visit: Admitting: Physician Assistant

## 2023-12-07 ENCOUNTER — Encounter: Payer: Self-pay | Admitting: Physician Assistant

## 2023-12-07 ENCOUNTER — Other Ambulatory Visit (INDEPENDENT_AMBULATORY_CARE_PROVIDER_SITE_OTHER)

## 2023-12-07 VITALS — BP 110/66 | HR 68 | Ht 70.0 in | Wt 162.4 lb

## 2023-12-07 DIAGNOSIS — K5909 Other constipation: Secondary | ICD-10-CM

## 2023-12-07 DIAGNOSIS — D649 Anemia, unspecified: Secondary | ICD-10-CM | POA: Diagnosis not present

## 2023-12-07 DIAGNOSIS — E538 Deficiency of other specified B group vitamins: Secondary | ICD-10-CM

## 2023-12-07 LAB — CBC WITH DIFFERENTIAL/PLATELET
Basophils Absolute: 0.1 10*3/uL (ref 0.0–0.1)
Basophils Relative: 1.2 % (ref 0.0–3.0)
Eosinophils Absolute: 0.1 10*3/uL (ref 0.0–0.7)
Eosinophils Relative: 1.9 % (ref 0.0–5.0)
HCT: 36.9 % — ABNORMAL LOW (ref 39.0–52.0)
Hemoglobin: 12.4 g/dL — ABNORMAL LOW (ref 13.0–17.0)
Lymphocytes Relative: 36.2 % (ref 12.0–46.0)
Lymphs Abs: 1.5 10*3/uL (ref 0.7–4.0)
MCHC: 33.6 g/dL (ref 30.0–36.0)
MCV: 98.1 fl (ref 78.0–100.0)
Monocytes Absolute: 0.4 10*3/uL (ref 0.1–1.0)
Monocytes Relative: 10.6 % (ref 3.0–12.0)
Neutro Abs: 2 10*3/uL (ref 1.4–7.7)
Neutrophils Relative %: 50.1 % (ref 43.0–77.0)
Platelets: 234 10*3/uL (ref 150.0–400.0)
RBC: 3.76 Mil/uL — ABNORMAL LOW (ref 4.22–5.81)
RDW: 13 % (ref 11.5–15.5)
WBC: 4.1 10*3/uL (ref 4.0–10.5)

## 2023-12-07 LAB — IBC + FERRITIN
Ferritin: 182.8 ng/mL (ref 22.0–322.0)
Iron: 103 ug/dL (ref 42–165)
Saturation Ratios: 29.1 % (ref 20.0–50.0)
TIBC: 354.2 ug/dL (ref 250.0–450.0)
Transferrin: 253 mg/dL (ref 212.0–360.0)

## 2023-12-07 LAB — B12 AND FOLATE PANEL
Folate: 12.1 ng/mL (ref >5.9)
Vitamin B-12: 176 pg/mL — ABNORMAL LOW (ref 211–911)
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
## 2023-12-07 NOTE — Patient Instructions (Signed)
 Your provider has requested that you go to the basement level for lab work before leaving today. Press "B" on the elevator. The lab is located at the first door on the left as you exit the elevator.  _______________________________________________________  If your blood pressure at your visit was 140/90 or greater, please contact your primary care physician to follow up on this.  _______________________________________________________  If you are age 79 or older, your body mass index should be between 23-30. Your Body mass index is 23.3 kg/m. If this is out of the aforementioned range listed, please consider follow up with your Primary Care Provider.  If you are age 8 or younger, your body mass index should be between 19-25. Your Body mass index is 23.3 kg/m. If this is out of the aformentioned range listed, please consider follow up with your Primary Care Provider.   ________________________________________________________  The [facility] providers would like to encourage you to use [facility] to communicate with providers for non-urgent requests or questions.  Due to long hold times on the telephone, sending your provider a message by [facility] may be a faster and more efficient way to get a response.  Please allow 48 business hours for a response.  Please remember that this is for non-urgent requests.  _______________________________________________________

## 2023-12-07 NOTE — Progress Notes (Addendum)
 ____________________________________________________________  Attending physician addendum:  Thank you for sending this case to me. I have reviewed the entire note as well as the result note regarding his B12 and folate levels.  As you point out, his B12 level is low at 176, though curiously was normal and labs in his health system 3 weeks ago.  His Hemoglobin was normal at that time, and his iron levels remain normal.  So even if he had a heme positive stool recently, it is not clear to me that blood loss explains this normocytic anemia.  Unlikely developed a source of lower GI bleeding since colonoscopy 3 years ago, he has not had his upper GI tract checked since an EGD with Dr. Grandville Lax in 2014 (incidentally, also done for heme positive stool) I agree with getting those results from his primary care provider, but he also needs to see them to start B12 replacement. If he was heme positive, then an upper endoscopy is warranted in my opinion but I would not do another colonoscopy at this point.  Lorella Roles, MD  ____________________________________________________________

## 2023-12-08 NOTE — Telephone Encounter (Signed)
 Thank you :)

## 2023-12-08 NOTE — Progress Notes (Signed)
 Spoke with Dr. Eloise Harman nurse and she confirmed all 3 hemoccult cards came back positive. She will fax blood test and hemoccult results to our office.

## 2023-12-08 NOTE — Progress Notes (Signed)
 Left message at patient PCP for recent lab studies.

## 2023-12-08 NOTE — Telephone Encounter (Signed)
 Awesome, thanks.

## 2023-12-10 NOTE — Progress Notes (Signed)
 Left message for patient to call office.

## 2023-12-13 ENCOUNTER — Encounter (HOSPITAL_COMMUNITY)

## 2023-12-13 ENCOUNTER — Telehealth: Payer: Self-pay | Admitting: Physician Assistant

## 2023-12-13 ENCOUNTER — Encounter (HOSPITAL_COMMUNITY)
Admission: RE | Admit: 2023-12-13 | Discharge: 2023-12-13 | Disposition: A | Discharge status: Home / Self Care | Source: Ambulatory Visit | Attending: Neurology | Admitting: Neurology

## 2023-12-13 DIAGNOSIS — R4789 Other speech disturbances: Secondary | ICD-10-CM | POA: Diagnosis not present

## 2023-12-13 DIAGNOSIS — Z818 Family history of other mental and behavioral disorders: Secondary | ICD-10-CM | POA: Insufficient documentation

## 2023-12-13 DIAGNOSIS — R413 Other amnesia: Secondary | ICD-10-CM | POA: Insufficient documentation

## 2023-12-13 DIAGNOSIS — R9089 Other abnormal findings on diagnostic imaging of central nervous system: Secondary | ICD-10-CM | POA: Diagnosis not present

## 2023-12-13 DIAGNOSIS — R251 Tremor, unspecified: Secondary | ICD-10-CM | POA: Diagnosis not present

## 2023-12-13 DIAGNOSIS — G309 Alzheimer's disease, unspecified: Secondary | ICD-10-CM | POA: Insufficient documentation

## 2023-12-13 MED ORDER — FLORBETAPIR F 18 500-1900 MBQ/ML IV SOLN
10.6170 | Freq: Once | INTRAVENOUS | Status: AC
  Administered 2023-12-13: 10.617 via INTRAVENOUS

## 2023-12-13 NOTE — Telephone Encounter (Signed)
 Patient returning call.

## 2023-12-13 NOTE — Telephone Encounter (Signed)
 Patient scheduled for EGD on 01/06/24 @ 9am. Instructions sent to patient via mail.

## 2023-12-13 NOTE — Progress Notes (Signed)
 Patient scheduled for EGD on 01/06/24 @ 9 am

## 2023-12-22 ENCOUNTER — Telehealth: Payer: Self-pay

## 2023-12-22 NOTE — Telephone Encounter (Signed)
 Inbound patient stating that he needed to speak with Herbert Seta as soon as possible. States he is scheduled for a EGD on 4/24 and is having a " problem". Please advise.

## 2023-12-22 NOTE — Telephone Encounter (Signed)
 Patient calls with concerns about constipation. Straining for his bowel movements despite BID Miralax. He took Mag Citrate yesterday and has not had a bowel movement since. He took mag Citrate last week as well. Tells me he has to take stimulate laxatives to "empty" himself out or he cannot eat. This worries him. He is asking for guidance.

## 2023-12-23 NOTE — Telephone Encounter (Signed)
 See telephone contact from 12/22/23

## 2023-12-23 NOTE — Telephone Encounter (Signed)
 Left message for patient to call office.

## 2023-12-23 NOTE — Telephone Encounter (Signed)
 Spoke with patient and informed him to complete a bowel purge per Jennifer,PA and to try Linzess 145 mcg. Patient will stop by the office today to pick up instructions for the bowel purge and Linzess samples

## 2023-12-28 ENCOUNTER — Telehealth: Payer: Self-pay

## 2023-12-28 NOTE — Telephone Encounter (Signed)
 Patient calling requesting to speak with you in regards to previous note. Please advise.

## 2023-12-28 NOTE — Telephone Encounter (Signed)
 Beth, can you please call this patient.

## 2023-12-28 NOTE — Telephone Encounter (Signed)
 Spoke with the patient. His concerns are varied and he struggles to stay on topic. The patient does tell me the Linzess did not work well for him. He took it for 3 days and stopped it. He has restarted Miralax twice daily. He plans to continue this.  The patient also tells me he is seeing blood in his urine. He does not see blood every time he urinates but "fairly often." He has difficulty feeling like he has emptied his bladder. He "feels bad." He is tired, his legs ache and he cannot sleep well. I have encouraged him to contact his PCP with this information. Continue daily Miralax. Colonoscopy next week.

## 2023-12-29 ENCOUNTER — Telehealth: Payer: Self-pay | Admitting: *Deleted

## 2023-12-29 DIAGNOSIS — R319 Hematuria, unspecified: Secondary | ICD-10-CM | POA: Diagnosis not present

## 2023-12-29 NOTE — Telephone Encounter (Signed)
 I called pt and relayed results of PET scan to him.  I made appt for him 01-17-2024 at 1015 to discuss and pet scan results and options.  He verbalized understanding.

## 2023-12-29 NOTE — Telephone Encounter (Signed)
-----   Message from Debbra Fairy sent at 12/29/2023  9:54 AM EDT ----- Patient's brain PET scan shows results positive for a possible underlying Alzheimer's disease pathology that puts him at risk for Alzheimer's dementia.  Please advise patient to return for a follow-up appointment to discuss treatment options and next steps.  Please assist with making an appointment.

## 2024-01-04 ENCOUNTER — Encounter: Payer: Self-pay | Admitting: Certified Registered Nurse Anesthetist

## 2024-01-06 ENCOUNTER — Encounter: Payer: Self-pay | Admitting: Gastroenterology

## 2024-01-06 ENCOUNTER — Ambulatory Visit: Admitting: Gastroenterology

## 2024-01-06 VITALS — BP 92/51 | HR 60 | Temp 98.2°F | Resp 18 | Ht 70.0 in | Wt 162.0 lb

## 2024-01-06 DIAGNOSIS — K259 Gastric ulcer, unspecified as acute or chronic, without hemorrhage or perforation: Secondary | ICD-10-CM

## 2024-01-06 DIAGNOSIS — R195 Other fecal abnormalities: Secondary | ICD-10-CM

## 2024-01-06 DIAGNOSIS — R634 Abnormal weight loss: Secondary | ICD-10-CM

## 2024-01-06 DIAGNOSIS — D649 Anemia, unspecified: Secondary | ICD-10-CM | POA: Diagnosis not present

## 2024-01-06 DIAGNOSIS — E538 Deficiency of other specified B group vitamins: Secondary | ICD-10-CM

## 2024-01-06 DIAGNOSIS — G4733 Obstructive sleep apnea (adult) (pediatric): Secondary | ICD-10-CM | POA: Diagnosis not present

## 2024-01-06 MED ORDER — SODIUM CHLORIDE 0.9 % IV SOLN: 500.0000 mL | Freq: Once | INTRAVENOUS | Status: DC

## 2024-01-06 MED ORDER — OMEPRAZOLE 20 MG PO CPDR: 20.0000 mg | DELAYED_RELEASE_CAPSULE | Freq: Every day | ORAL | 3 refills | Status: DC

## 2024-01-06 NOTE — Op Note (Signed)
 [facility] Patient Name: Tyler Reid Procedure Date: 01/06/2024 10:03 AM MRN: [id number] Endoscopist: Ace Abu L. Dominic Friendly , MD, [id number] Age: 78 Referring MD:  Date of Birth: [birth year] Gender: Male Account #: [id number] Procedure:                Upper GI endoscopy Indications:              Pernicious anemia (B12 deficiency on recent labs),                            Heme positive stool, Weight loss                           Clinical details in office consult note dated                            12/13/2023                           CTAP January 2025 unrevealing for source of anemia                            or weight loss.                           Hemoglobin 12.4, normal iron levels and folate.                           Diminutive polyp and otherwise normal colonoscopy                            March 2022 Medicines:                Monitored Anesthesia Care Procedure:                Pre-Anesthesia Assessment:                           - Prior to the procedure, a History and Physical                            was performed, and patient medications and                            allergies were reviewed. The patient's tolerance of                            previous anesthesia was also reviewed. The risks                            and benefits of the procedure and the sedation                            options and risks were discussed with the patient.                            All questions were answered,  and informed consent                            was obtained. Prior Anticoagulants: The patient has                            taken no anticoagulant or antiplatelet agents. ASA                            Grade Assessment: II - A patient with mild systemic                            disease. After reviewing the risks and benefits,                            the patient was deemed in satisfactory condition to                            undergo the procedure.                            After obtaining informed consent, the endoscope was                            passed under direct vision. Throughout the                            procedure, the patient's blood pressure, pulse, and                            oxygen  saturations were monitored continuously. The                            GIF [id number] #[id number] was introduced through the                            mouth, and advanced to the second part of duodenum.                            The upper GI endoscopy was accomplished without                            difficulty. The patient tolerated the procedure                            well. Scope In: Scope Out: Findings:                 The larynx was normal.                           The esophagus was normal.                           Several erosions with no bleeding and no stigmata  of recent bleeding were found in the prepyloric                            region of the stomach. Several biopsies were                            obtained on the greater curvature of the gastric                            body, on the lesser curvature of the gastric body,                            on the greater curvature of the gastric antrum and                            on the lesser curvature of the gastric antrum with                            cold forceps for histology. (Jar 1 antrum, rule out                            H. pylori and autoimmune gastritis, jar 2 gastric                            body for same)                           The exam of the stomach was otherwise normal.                           The cardia and gastric fundus were normal on                            retroflexion.                           The examined duodenum was normal. Complications:            No immediate complications. Estimated Blood Loss:     Estimated blood loss was minimal. Impression:               - Normal larynx.                           - Normal  esophagus.                           - Gastric erosions with no bleeding and no stigmata                            of recent bleeding.                           - Normal examined duodenum.                           -  Several biopsies were obtained on the greater                            curvature of the gastric body, on the lesser                            curvature of the gastric body, on the greater                            curvature of the gastric antrum and on the lesser                            curvature of the gastric antrum.                           At this juncture, this patient's weight loss does                            not appear to be GI in nature. While the gastric                            erosions likely account for heme positive stool                            (which the patient has had in the past requiring                            endoscopic workup), it is not felt likely that                            there is a lower GI source of blood loss. In                            addition, the overall clinical suggest that the                            normocytic anemia with normal iron levels is not                            the result of chronic GI blood loss.                           Years of constipation, variably responsive to                            MiraLAX treatment. Consider discussion with                            cardiology about changing amlodipine  to other                            agent, as calcium  channel blocker may be  contributing to decreased colonic motility. Recommendation:           - Patient has a contact number available for                            emergencies. The signs and symptoms of potential                            delayed complications were discussed with the                            patient. Return to normal activities tomorrow.                            Written discharge instructions were  provided to the                            patient.                           - Resume previous diet.                           - Continue present medications.                           - Await pathology results.                           - Return to primary care physician (management and                            follow-up of B12 deficiency as well as exploration                            of other causes of weight loss). Kemuel Buchmann L. Dominic Friendly, MD 01/06/2024 10:40:25 AM This report has been signed electronically.

## 2024-01-06 NOTE — Progress Notes (Signed)
 Report given to PACU, vss

## 2024-01-06 NOTE — Patient Instructions (Signed)

## 2024-01-06 NOTE — Progress Notes (Signed)
 Called to room to assist during endoscopic procedure.  Patient ID and intended procedure confirmed with present staff. Received instructions for my participation in the procedure from the performing physician.

## 2024-01-06 NOTE — Progress Notes (Signed)
 No significant changes to clinical history since GI office visit on 12/13/23.  The patient is appropriate for an endoscopic procedure in the ambulatory setting.  - Amada Jupiter, MD

## 2024-01-06 NOTE — Progress Notes (Signed)
0947 Robinul 0.1 mg IV given due large amount of secretions upon assessment.  MD made aware, vss  °

## 2024-01-07 ENCOUNTER — Telehealth: Payer: Self-pay

## 2024-01-07 DIAGNOSIS — N390 Urinary tract infection, site not specified: Secondary | ICD-10-CM | POA: Diagnosis not present

## 2024-01-07 DIAGNOSIS — K259 Gastric ulcer, unspecified as acute or chronic, without hemorrhage or perforation: Secondary | ICD-10-CM | POA: Diagnosis not present

## 2024-01-07 DIAGNOSIS — R634 Abnormal weight loss: Secondary | ICD-10-CM | POA: Diagnosis not present

## 2024-01-07 DIAGNOSIS — K5900 Constipation, unspecified: Secondary | ICD-10-CM | POA: Diagnosis not present

## 2024-01-07 DIAGNOSIS — I10 Essential (primary) hypertension: Secondary | ICD-10-CM | POA: Diagnosis not present

## 2024-01-07 DIAGNOSIS — R413 Other amnesia: Secondary | ICD-10-CM | POA: Diagnosis not present

## 2024-01-07 DIAGNOSIS — D649 Anemia, unspecified: Secondary | ICD-10-CM | POA: Diagnosis not present

## 2024-01-07 DIAGNOSIS — E538 Deficiency of other specified B group vitamins: Secondary | ICD-10-CM | POA: Diagnosis not present

## 2024-01-07 DIAGNOSIS — R310 Gross hematuria: Secondary | ICD-10-CM | POA: Diagnosis not present

## 2024-01-07 NOTE — Telephone Encounter (Signed)
  Follow up Call-     01/06/2024    9:28 AM  Call back number  Post procedure Call Back phone  # [phone]  Permission to leave phone message Yes     Patient questions:  Do you have a fever, pain , or abdominal swelling? No. Pain Score  0 *  Have you tolerated food without any problems? Yes.    Have you been able to return to your normal activities? Yes.    Do you have any questions about your discharge instructions: Diet   No. Medications  No. Follow up visit  No.  Do you have questions or concerns about your Care? YES. Pt denies fever or pain but would like Dr. Dominic Friendly to know he urinated blood after the procedure on 01/06/2024  .   Actions: * If pain score is 4 or above: No action needed, pain <4.

## 2024-01-07 NOTE — Telephone Encounter (Signed)
 Thank you for the update.  Please call this man's wife (the patient has dementia) and recommend that since he reported urinating blood last evening, they need to call his primary care provider today to report that.  Urinating blood is not an effect of an upper endoscopy and it needs further attention.  - H. Dominic Friendly, MD

## 2024-01-10 LAB — SURGICAL PATHOLOGY

## 2024-01-11 ENCOUNTER — Encounter: Payer: Self-pay | Admitting: Gastroenterology

## 2024-01-13 DIAGNOSIS — H02051 Trichiasis without entropian right upper eyelid: Secondary | ICD-10-CM | POA: Diagnosis not present

## 2024-01-13 DIAGNOSIS — H43393 Other vitreous opacities, bilateral: Secondary | ICD-10-CM | POA: Diagnosis not present

## 2024-01-13 DIAGNOSIS — H35073 Retinal telangiectasis, bilateral: Secondary | ICD-10-CM | POA: Diagnosis not present

## 2024-01-13 DIAGNOSIS — H524 Presbyopia: Secondary | ICD-10-CM | POA: Diagnosis not present

## 2024-01-13 DIAGNOSIS — H16223 Keratoconjunctivitis sicca, not specified as Sjogren's, bilateral: Secondary | ICD-10-CM | POA: Diagnosis not present

## 2024-01-13 DIAGNOSIS — Z961 Presence of intraocular lens: Secondary | ICD-10-CM | POA: Diagnosis not present

## 2024-01-13 DIAGNOSIS — H35371 Puckering of macula, right eye: Secondary | ICD-10-CM | POA: Diagnosis not present

## 2024-01-13 NOTE — Telephone Encounter (Signed)
 Called patient's wife regarding his complaint regarding blood in his urine.  Left detailed VM stating he needed to contact his PCP that this was not related to his procedure.

## 2024-01-14 ENCOUNTER — Telehealth: Payer: Self-pay

## 2024-01-14 NOTE — Telephone Encounter (Signed)
 LVM for pt to bring his CPAP Machine with him to his Appointment on Monday

## 2024-01-17 ENCOUNTER — Emergency Department (HOSPITAL_COMMUNITY)
Admission: EM | Admit: 2024-01-17 | Discharge: 2024-01-17 | Disposition: A | Discharge status: Home / Self Care | Attending: Emergency Medicine | Admitting: Emergency Medicine

## 2024-01-17 ENCOUNTER — Encounter: Payer: Self-pay | Admitting: Neurology

## 2024-01-17 ENCOUNTER — Emergency Department (HOSPITAL_COMMUNITY)

## 2024-01-17 ENCOUNTER — Ambulatory Visit (INDEPENDENT_AMBULATORY_CARE_PROVIDER_SITE_OTHER): Admitting: Neurology

## 2024-01-17 ENCOUNTER — Encounter (HOSPITAL_COMMUNITY): Payer: Self-pay | Admitting: Emergency Medicine

## 2024-01-17 VITALS — BP 109/58 | HR 66 | Ht 70.0 in | Wt 155.6 lb

## 2024-01-17 DIAGNOSIS — Z7982 Long term (current) use of aspirin: Secondary | ICD-10-CM | POA: Insufficient documentation

## 2024-01-17 DIAGNOSIS — N2889 Other specified disorders of kidney and ureter: Secondary | ICD-10-CM | POA: Diagnosis not present

## 2024-01-17 DIAGNOSIS — R413 Other amnesia: Secondary | ICD-10-CM | POA: Diagnosis not present

## 2024-01-17 DIAGNOSIS — R197 Diarrhea, unspecified: Secondary | ICD-10-CM | POA: Insufficient documentation

## 2024-01-17 DIAGNOSIS — R93429 Abnormal radiologic findings on diagnostic imaging of unspecified kidney: Secondary | ICD-10-CM | POA: Diagnosis not present

## 2024-01-17 DIAGNOSIS — K6389 Other specified diseases of intestine: Secondary | ICD-10-CM | POA: Diagnosis not present

## 2024-01-17 DIAGNOSIS — G3184 Mild cognitive impairment, so stated: Secondary | ICD-10-CM

## 2024-01-17 DIAGNOSIS — R5383 Other fatigue: Secondary | ICD-10-CM | POA: Insufficient documentation

## 2024-01-17 DIAGNOSIS — R109 Unspecified abdominal pain: Secondary | ICD-10-CM | POA: Diagnosis present

## 2024-01-17 DIAGNOSIS — R1084 Generalized abdominal pain: Secondary | ICD-10-CM

## 2024-01-17 DIAGNOSIS — K769 Liver disease, unspecified: Secondary | ICD-10-CM | POA: Diagnosis not present

## 2024-01-17 LAB — URINALYSIS, ROUTINE W REFLEX MICROSCOPIC
Bilirubin Urine: NEGATIVE
Glucose, UA: NEGATIVE mg/dL
Hgb urine dipstick: NEGATIVE
Ketones, ur: 5 mg/dL — AB
Leukocytes,Ua: NEGATIVE
Nitrite: NEGATIVE
Protein, ur: NEGATIVE mg/dL
Specific Gravity, Urine: 1.017 (ref 1.005–1.030)
pH: 5 (ref 5.0–8.0)

## 2024-01-17 LAB — COMPREHENSIVE METABOLIC PANEL WITH GFR
ALT: 23 U/L (ref 0–44)
AST: 31 U/L (ref 15–41)
Albumin: 4.3 g/dL (ref 3.5–5.0)
Alkaline Phosphatase: 33 U/L — ABNORMAL LOW (ref 38–126)
Anion gap: 12 (ref 5–15)
BUN: 21 mg/dL (ref 8–23)
CO2: 27 mmol/L (ref 22–32)
Calcium: 9.8 mg/dL (ref 8.9–10.3)
Chloride: 95 mmol/L — ABNORMAL LOW (ref 98–111)
Creatinine, Ser: 1.08 mg/dL (ref 0.61–1.24)
GFR, Estimated: >60 mL/min (ref >60)
Glucose, Bld: 97 mg/dL (ref 70–99)
Potassium: 4 mmol/L (ref 3.5–5.1)
Sodium: 134 mmol/L — ABNORMAL LOW (ref 135–145)
Total Bilirubin: 0.9 mg/dL (ref 0.0–1.2)
Total Protein: 6.8 g/dL (ref 6.5–8.1)

## 2024-01-17 LAB — CBC
HCT: 36.6 % — ABNORMAL LOW (ref 39.0–52.0)
Hemoglobin: 12.4 g/dL — ABNORMAL LOW (ref 13.0–17.0)
MCH: 32.8 pg (ref 26.0–34.0)
MCHC: 33.9 g/dL (ref 30.0–36.0)
MCV: 96.8 fL (ref 80.0–100.0)
Platelets: 217 10*3/uL (ref 150–400)
RBC: 3.78 MIL/uL — ABNORMAL LOW (ref 4.22–5.81)
RDW: 12.5 % (ref 11.5–15.5)
WBC: 5.9 10*3/uL (ref 4.0–10.5)
nRBC: 0 % (ref 0.0–0.2)

## 2024-01-17 LAB — LIPASE, BLOOD: Lipase: 47 U/L (ref 11–51)

## 2024-01-17 NOTE — Discharge Instructions (Signed)
 Your history, exam, and workup today revealed you do have a small abnormality on the CT scan of your abdomen and pelvis that showed a abnormal tissue or mass of some kind on your left kidney.  I spoke with Dr. Cathi Cluster with [facility] who recommended you call their office tomorrow to get scheduled to see them in clinic to determine the next steps in workup.  Please rest and stay hydrated.  You did have diarrhea on the CT scan so anticipate you will need to increase your hydration for the next few days as well.  Please rest.  If any symptoms change or worsen acutely, return to the nearest emergency department.

## 2024-01-17 NOTE — ED Provider Notes (Signed)
 4:00 PM Care assumed from Dr. Gordon Latus.  At time of transfer of care, patient awaiting for results of CT abdomen pelvis to look for concern etiology of the transient abdominal pain patient reportedly had earlier and was sent here for evaluation of.  Given improvement in symptoms, if CT scan reassuring, anticipate discharge home with outpatient plans to follow-up with gastroenterology.  6:29 PM CT scan showed diarrheal state but no evidence of diverticulitis or other bowel acute abnormality.  We did however show was possible renal cell carcinoma in the left kidney.  It has grown since January when was felt to be a possible cyst.  Radiology documented needing urology consultation.  I went to tell the patient this and he does say he has had more fatigue her last few months and is lost about 15 pounds without trying over the last few months as well.  Will call urology to see if they want any further imaging today or if he is were for outpatient workup and management.  Otherwise vital signs reassuring and patient is feeling well now.  Anticipate follow-up on urology recommendations.  Spoke to Dr. Cathi Cluster with urology who feels the patient does indeed have an abnormality on the imaging that we will need urology follow-up.  He recommended calling [facility] tomorrow to discuss follow-up soon as possible but he did not need further imaging ultrasounds or MRI in the emergency department tonight.  Will discharge with urology follow-up and patient agrees with plan.  He had no other questions or concerns and was discharged in good condition.  Clinical Impression: 1. Generalized abdominal pain   2. Diarrhea, unspecified type   3. Fatigue, unspecified type   4. Renal mass, left   5. Abnormal CT scan, kidney     Disposition: Discharge  Condition: Good  I have discussed the results, Dx and Tx plan with the pt(& family if present). He/she/they expressed understanding and agree(s) with the plan.  Discharge instructions discussed at great length. Strict return precautions discussed and pt &/or family have verbalized understanding of the instructions. No further questions at time of discharge.    New Prescriptions   No medications on file    Follow Up: [facility] [address] [place]  [id number] [phone]       Mika Anastasi, Marine Sia, MD 01/17/24 2015

## 2024-01-17 NOTE — Patient Instructions (Addendum)
 Please talk to Dr. Efraim Grange about your GI problem. If you started having GI problems after starting the donepezil, you may have to stop it.  Even primidone can cause stomach and GI issues including diarrhea.  I recommend that you talk to your PCP about these medicines and potential changes.  Please keep your appointment with Dr. Annette Barters. If you need to delay the appointment with them, please call their office directly.

## 2024-01-17 NOTE — ED Provider Notes (Signed)
 [facility] [facility] Provider Note   CSN: [id number] Arrival date & time: 01/17/24  1119     History  Chief Complaint  Patient presents with   Abdominal Pain    Tyler Reid is a 78 y.o. male presented to ED complaining of intermittent abdominal pains.  Patient reports he has had issues with GI problems for a long time.  He had an endoscopy done last month which I reviewed the records but he had erosions noted in the gastric antrum, for which she takes PPI medications.  He reports that he has not been able to consistently have a bowel movement for weeks.  He says the only way to do the bowel movement is if he takes MiraLAX for a "purge of my bowels" and then has a large one, including 2 days ago.  But after that he is only able to pass small pebbles.  He says he occasionally feels nauseated and has to vomit.  He denies abdominal bloating or persistent abdominal pain.  He is frustrated because he feels that his doctors cannot figure out what is causing his constipation and bowel issues.  HPI     Home Medications Prior to Admission medications   Medication Sig Start Date End Date Taking? Authorizing Provider  amLODipine  (NORVASC ) 10 MG tablet Take 1 tablet (10 mg total) by mouth daily. 09/02/23   Nahser, Lela Purple, MD  ASPIRIN LOW DOSE 81 MG EC tablet Take 81 mg by mouth daily. 10/10/18   [provider]  augmented betamethasone dipropionate (DIPROLENE-AF) 0.05 % cream Apply topically 2 (two) times daily as needed. Patient not taking: Reported on 01/17/2024 01/26/23   [provider]  Bempedoic Acid-Ezetimibe (NEXLIZET ) 180-10 MG TABS Take 1 tablet by mouth daily. 05/28/23   Nahser, Lela Purple, MD  bisoprolol (ZEBETA) 5 MG tablet Take 5 mg by mouth daily. Patient not taking: Reported on 01/17/2024    [provider]  Cholecalciferol (VITAMIN D) 125 MCG (5000 UT) CAPS Take by mouth. One daily    [provider]  donepezil  (ARICEPT) 5 MG tablet Take 5 mg by mouth at bedtime. 10/25/23   [provider]  finasteride (PROPECIA) 1 MG tablet Take one tablet by mouth daily Oral Patient not taking: Reported on 01/17/2024 10/24/19   [provider]  gabapentin (NEURONTIN) 100 MG capsule Take 200 mg by mouth in the morning and at bedtime.    [provider]  ketoconazole (NIZORAL) 2 % cream Apply 1 Application topically daily. Patient not taking: Reported on 01/17/2024 10/20/23   [provider]  losartan  (COZAAR ) 50 MG tablet Take 50 mg by mouth daily. 11/08/23   [provider]  metroNIDAZOLE (METROGEL) 0.75 % gel  10/08/22   [provider]  nitroGLYCERIN (NITROSTAT) 0.4 MG SL tablet take 1 tablet under the tongue as needed for chest pain Sublingual Patient not taking: Reported on 01/17/2024 10/10/18   [provider]  omeprazole  (PRILOSEC) 20 MG capsule Take 1 capsule (20 mg total) by mouth daily with lunch. 01/06/24   Albertina Hugger, MD  polyethylene glycol (MIRALAX / GLYCOLAX) 17 g packet Take 17 g by mouth daily.    [provider]  primidone (MYSOLINE) 50 MG tablet Take 50 mg by mouth at bedtime.    [provider]  RESTASIS 0.05 % ophthalmic emulsion Place 1 drop into both eyes 2 (two) times daily. 04/04/20   [provider]  triamterene-hydrochlorothiazide Earlean Glaze)  37.5-25 MG capsule Take 1 capsule by mouth daily.    [provider]      Allergies    Bupropion, Evolocumab , Niacin and related, Sertraline, Statins, Trazodone hcl, and Venlafaxine    Review of Systems   Review of Systems  Physical Exam Updated Vital Signs BP 111/64   Pulse 60   Temp 98.3 F (36.8 C)   Resp 16   SpO2 100%  Physical Exam Constitutional:      General: He is not in acute distress. HENT:     Head: Normocephalic and atraumatic.  Eyes:     Conjunctiva/sclera: Conjunctivae normal.     Pupils: Pupils are equal, round, and reactive to  light.  Cardiovascular:     Rate and Rhythm: Normal rate and regular rhythm.  Pulmonary:     Effort: Pulmonary effort is normal. No respiratory distress.  Abdominal:     General: There is no distension.     Tenderness: There is no abdominal tenderness.  Skin:    General: Skin is warm and dry.  Neurological:     General: No focal deficit present.     Mental Status: He is alert. Mental status is at baseline.  Psychiatric:        Mood and Affect: Mood normal.        Behavior: Behavior normal.     ED Results / Procedures / Treatments   Labs (all labs ordered are listed, but only abnormal results are displayed) Labs Reviewed  COMPREHENSIVE METABOLIC PANEL WITH GFR - Abnormal; Notable for the following components:      Result Value   Sodium 134 (*)    Chloride 95 (*)    Alkaline Phosphatase 33 (*)    All other components within normal limits  CBC - Abnormal; Notable for the following components:   RBC 3.78 (*)    Hemoglobin 12.4 (*)    HCT 36.6 (*)    All other components within normal limits  URINALYSIS, ROUTINE W REFLEX MICROSCOPIC - Abnormal; Notable for the following components:   Ketones, ur 5 (*)    All other components within normal limits  LIPASE, BLOOD    EKG None  Radiology No results found.  Procedures Procedures    Medications Ordered in ED Medications - No data to display  ED Course/ Medical Decision Making/ A&P                                 Medical Decision Making Amount and/or Complexity of Data Reviewed Labs: ordered. Radiology: ordered.   This patient presents to the ED with concern for intermittent abdominal pains, constipation issues, nausea vomiting. This involves an extensive number of treatment options, and is a complaint that carries with it a high risk of complications and morbidity.  The differential diagnosis includes IBS versus medication side effect versus diverticulitis or colitis versus other  Unfortunately the patient appears  to be a poor historian.  He does have reported history of cognitive memory loss from his neurology evaluation, and in fact was seen by the neurologist this morning.  The patient cannot clearly explain to me why he came to the emergency department today, other than to express frustration over the ongoing GI issues.  I explained to him that it is unlikely that we will find a clear diagnosis for his problems with him going on for some time.  He is concerned that he has lost weight,  maybe 15 pounds over the past few months.  I told him this issue typically would need to be worked up by gastroenterologist, but at this point I think a CT scan would be reasonable to ensure he does not have colitis, diverticulitis, or ileus.  He is not actively vomiting and he is passing gas.  Additional history obtained from patient's wife  External records from outside source obtained and reviewed including endoscopy report from April of this year, colonoscopy from 2022 with a single polyp removed in the descending colon but otherwise normal findings  I ordered and personally interpreted labs.  The pertinent results include: No emergent findings  I ordered imaging studies including CT abdomen pelvis, which was pending at the time of signout  Test Considered: Doubt AAA, mesenteric ischemia   I spoke to the patient his wife to explain that I do not see any evidence of significant dehydration or malnourishment per his labs or physical exam. We are still awaiting CT scan at the time of my signout to afternoon ED provider team at 3 pm. If there are no emergent findings on imaging,  I recommend that he follow-up with his primary care provider or GI doctor if he continues having these intermittent GI issues.           Final Clinical Impression(s) / ED Diagnoses Final diagnoses:  None    Rx / DC Orders ED Discharge Orders     None         Arvilla Birmingham, MD 01/17/24 1451
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
## 2024-01-17 NOTE — ED Triage Notes (Signed)
 Pt  here from home with c/o abd pain burning  pain and has recently started  on reflux meds , also had a endoscopy that was fine

## 2024-01-18 DIAGNOSIS — D4102 Neoplasm of uncertain behavior of left kidney: Secondary | ICD-10-CM | POA: Diagnosis not present

## 2024-01-18 DIAGNOSIS — N200 Calculus of kidney: Secondary | ICD-10-CM | POA: Diagnosis not present

## 2024-01-18 DIAGNOSIS — N400 Enlarged prostate without lower urinary tract symptoms: Secondary | ICD-10-CM | POA: Diagnosis not present

## 2024-01-25 DIAGNOSIS — K259 Gastric ulcer, unspecified as acute or chronic, without hemorrhage or perforation: Secondary | ICD-10-CM | POA: Diagnosis not present

## 2024-01-25 DIAGNOSIS — R251 Tremor, unspecified: Secondary | ICD-10-CM | POA: Diagnosis not present

## 2024-01-25 DIAGNOSIS — F32A Depression, unspecified: Secondary | ICD-10-CM | POA: Diagnosis not present

## 2024-01-25 DIAGNOSIS — I10 Essential (primary) hypertension: Secondary | ICD-10-CM | POA: Diagnosis not present

## 2024-01-25 DIAGNOSIS — D649 Anemia, unspecified: Secondary | ICD-10-CM | POA: Diagnosis not present

## 2024-01-25 DIAGNOSIS — R634 Abnormal weight loss: Secondary | ICD-10-CM | POA: Diagnosis not present

## 2024-01-25 DIAGNOSIS — K5900 Constipation, unspecified: Secondary | ICD-10-CM | POA: Diagnosis not present

## 2024-01-25 DIAGNOSIS — R413 Other amnesia: Secondary | ICD-10-CM | POA: Diagnosis not present

## 2024-01-25 DIAGNOSIS — E538 Deficiency of other specified B group vitamins: Secondary | ICD-10-CM | POA: Diagnosis not present

## 2024-02-02 ENCOUNTER — Other Ambulatory Visit (INDEPENDENT_AMBULATORY_CARE_PROVIDER_SITE_OTHER)

## 2024-02-02 ENCOUNTER — Ambulatory Visit (INDEPENDENT_AMBULATORY_CARE_PROVIDER_SITE_OTHER): Admitting: Physician Assistant

## 2024-02-02 ENCOUNTER — Encounter: Payer: Self-pay | Admitting: Physician Assistant

## 2024-02-02 VITALS — BP 122/68 | HR 77 | Ht 70.0 in | Wt 151.0 lb

## 2024-02-02 DIAGNOSIS — K921 Melena: Secondary | ICD-10-CM

## 2024-02-02 DIAGNOSIS — E538 Deficiency of other specified B group vitamins: Secondary | ICD-10-CM

## 2024-02-02 DIAGNOSIS — R195 Other fecal abnormalities: Secondary | ICD-10-CM | POA: Diagnosis not present

## 2024-02-02 DIAGNOSIS — G308 Other Alzheimer's disease: Secondary | ICD-10-CM

## 2024-02-02 DIAGNOSIS — K5900 Constipation, unspecified: Secondary | ICD-10-CM | POA: Diagnosis not present

## 2024-02-02 DIAGNOSIS — K219 Gastro-esophageal reflux disease without esophagitis: Secondary | ICD-10-CM

## 2024-02-02 DIAGNOSIS — F0280 Dementia in other diseases classified elsewhere without behavioral disturbance: Secondary | ICD-10-CM

## 2024-02-02 DIAGNOSIS — R251 Tremor, unspecified: Secondary | ICD-10-CM

## 2024-02-02 DIAGNOSIS — R6881 Early satiety: Secondary | ICD-10-CM | POA: Diagnosis not present

## 2024-02-02 DIAGNOSIS — R634 Abnormal weight loss: Secondary | ICD-10-CM

## 2024-02-02 DIAGNOSIS — D649 Anemia, unspecified: Secondary | ICD-10-CM

## 2024-02-02 DIAGNOSIS — K5909 Other constipation: Secondary | ICD-10-CM

## 2024-02-02 DIAGNOSIS — K2980 Duodenitis without bleeding: Secondary | ICD-10-CM

## 2024-02-02 LAB — VITAMIN B12: Vitamin B-12: 664 pg/mL (ref 211–911)

## 2024-02-02 MED ORDER — LUBIPROSTONE 24 MCG PO CAPS: 24.0000 ug | ORAL_CAPSULE | Freq: Two times a day (BID) | ORAL | 0 refills | Status: DC

## 2024-02-02 MED ORDER — PANTOPRAZOLE SODIUM 40 MG PO TBEC: 40.0000 mg | DELAYED_RELEASE_TABLET | Freq: Two times a day (BID) | ORAL | 0 refills | Status: DC

## 2024-02-02 NOTE — Patient Instructions (Signed)
 Please take your proton pump inhibitor medication, pantoprazole 40 mg BID  Please take this medication 30 minutes to 1 hour before meals- this makes it more effective.  Avoid spicy and acidic foods Avoid fatty foods Limit your intake of coffee, tea, alcohol , and carbonated drinks Work to maintain a healthy weight Keep the head of the bed elevated at least 3 inches with blocks or a wedge pillow if you are having any nighttime symptoms Stay upright for 2 hours after eating Avoid meals and snacks three to four hours before bedtime  Try amitiza  24 MCG twice a day for constipation Add on benefiber one spoonful daily   Gastroparesis Please do small frequent meals like 4-6 meals a day.  Eat and drink liquids at separate times.  Avoid high fiber foods, cook your vegetables, avoid high fat food.  Suggest spreading protein throughout the day (greek yogurt, glucerna, soft meat, milk, eggs) Choose soft foods that you can mash with a fork When you are more symptomatic, change to pureed foods foods and liquids.  Consider reading "Living well with Gastroparesis" by Creasie Doctor Gastroparesis is a condition in which food takes longer than normal to empty from the stomach. This condition is also known as delayed gastric emptying. It is usually a long-term (chronic) condition. There is no cure, but there are treatments and things that you can do at home to help relieve symptoms. Treating the underlying condition that causes gastroparesis can also help relieve symptoms What are the causes? In many cases, the cause of this condition is not known. Possible causes include: A hormone (endocrine) disorder, such as hypothyroidism or diabetes. A nervous system disease, such as Parkinson's disease or multiple sclerosis. Cancer, infection, or surgery that affects the stomach or vagus nerve. The vagus nerve runs from your chest, through your neck, and to the lower part of your brain. A connective tissue  disorder, such as scleroderma. Certain medicines. What increases the risk? You are more likely to develop this condition if: You have certain disorders or diseases. These may include: An endocrine disorder. An eating disorder. Amyloidosis. Scleroderma. Parkinson's disease. Multiple sclerosis. Cancer or infection of the stomach or the vagus nerve. You have had surgery on your stomach or vagus nerve. You take certain medicines. You are male. What are the signs or symptoms? Symptoms of this condition include: Feeling full after eating very little or a loss of appetite. Nausea, vomiting, or heartburn. Bloating of your abdomen. Inconsistent blood sugar (glucose) levels on blood tests. Unexplained weight loss. Acid from the stomach coming up into the esophagus (gastroesophageal reflux). Sudden tightening (spasm) of the stomach, which can be painful. Symptoms may come and go. Some people may not notice any symptoms. How is this diagnosed? This condition is diagnosed with tests, such as: Tests that check how long it takes food to move through the stomach and intestines. These tests include: Upper gastrointestinal (GI) series. For this test, you drink a liquid that shows up well on X-rays, and then X-rays are taken of your intestines. Gastric emptying scintigraphy. For this test, you eat food that contains a small amount of radioactive material, and then scans are taken. Wireless capsule GI monitoring system. For this test, you swallow a pill (capsule) that records information about how foods and fluid move through your stomach. Gastric manometry. For this test, a tube is passed down your throat and into your stomach to measure electrical and muscular activity. Endoscopy. For this test, a long, thin tube with a camera  and light on the end is passed down your throat and into your stomach to check for problems in your stomach lining. Ultrasound. This test uses sound waves to create images of  the inside of your body. This can help rule out gallbladder disease or pancreatitis as a cause of your symptoms. How is this treated? There is no cure for this condition, but treatment and home care may relieve symptoms. Treatment may include: Treating the underlying cause. Managing your symptoms by making changes to your diet and exercise habits. Taking medicines to control nausea and vomiting and to stimulate stomach muscles. Getting food through a feeding tube in the hospital. This may be done in severe cases. Having surgery to insert a device called a gastric electrical stimulator into your body. This device helps improve stomach emptying and control nausea and vomiting. Follow these instructions at home: Take over-the-counter and prescription medicines only as told by your health care provider. Follow instructions from your health care provider about eating or drinking restrictions. Your health care provider may recommend that you: Eat smaller meals more often. Eat low-fat foods. Eat low-fiber forms of high-fiber foods. For example, eat cooked vegetables instead of raw vegetables. Have only liquid foods instead of solid foods. Liquid foods are easier to digest. Drink enough fluid to keep your urine pale yellow. Exercise as often as told by your health care provider. Keep all follow-up visits. This is important. Contact a health care provider if you: Notice that your symptoms do not improve with treatment. Have new symptoms. Get help right away if you: Have severe pain in your abdomen that does not improve with treatment. Have nausea that is severe or does not go away. Vomit every time you drink fluids. Summary Gastroparesis is a long-term (chronic) condition in which food takes longer than normal to empty from the stomach. Symptoms include nausea, vomiting, heartburn, bloating of your abdomen, and loss of appetite. Eating smaller portions, low-fat foods, and low-fiber forms of  high-fiber foods may help you manage your symptoms. Get help right away if you have severe pain in your abdomen. This information is not intended to replace advice given to you by your health care provider. Make sure you discuss any questions you have with your health care provider. Document Revised: 01/08/2020 Document Reviewed: 01/08/2020 Elsevier Patient Education  2021 [employer]   You have been scheduled for a gastric emptying scan at [facility] on 02/18/2024 at 7:00 am. Please arrive at least 30 minutes prior to your appointment for registration. Please make certain not to have anything to eat or drink after midnight the night before your test. Hold all stomach medications (ex: Zofran , phenergan, Reglan) 24 hours prior to your test. If you need to reschedule your appointment, please contact radiology scheduling at [phone]. _____________________________________________________________________ A gastric-emptying study measures how long it takes for food to move through your stomach. There are several ways to measure stomach emptying. In the most common test, you eat food that contains a small amount of radioactive material. A scanner that detects the movement of the radioactive material is placed over your abdomen to monitor the rate at which food leaves your stomach. This test normally takes about 4 hours to complete. _____________________________________________________________________   Due to recent changes in healthcare laws, you may see the results of your imaging and laboratory studies on MyChart before your provider has had a chance to review them.  We understand that in some cases there may be results that are confusing  or concerning to you. Not all laboratory results come back in the same time frame and the provider may be waiting for multiple results in order to interpret others.  Please give us  48 hours in order for your provider to thoroughly review all the results  before contacting the office for clarification of your results.
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
## 2024-02-03 ENCOUNTER — Ambulatory Visit: Payer: Self-pay | Admitting: Physician Assistant

## 2024-02-03 NOTE — Progress Notes (Signed)
 ____________________________________________________________  Attending physician addendum:  Thank you for sending this case to me. I have reviewed the entire note and agree with the plan.  I agree it is difficult to get a clear and consistent sense of his GI symptoms given his dementia. Doubt he truly has significant reflux. A gastric emptying study is entirely reasonable.  If that is normal, then this weight loss (which is certainly concerning) does not appear to be GI in nature. Renal mass needs further workup as recommended My opinion is that his CT findings regarding the colon may be from excess use of laxatives due to his perceived constipation.  Lorella Roles, MD  ____________________________________________________________

## 2024-02-11 ENCOUNTER — Telehealth: Payer: Self-pay | Admitting: Physician Assistant

## 2024-02-11 NOTE — Telephone Encounter (Signed)
 Patient wife is returning a call back about her husband results. Patient wife is requesting a call back. Please advise.

## 2024-02-11 NOTE — Telephone Encounter (Signed)
 I spoke with wife Tyler Reid and let her know that he is to continue his B12 tablet. While on the phone with her she told me her husband cancelled the gastric emptying scan because he said they aren't finding anything. He is also going to stop his Amitiza  as he said it makes his blood pressure go down and his heart rate go up. I told him I will pass this information on to [name] PA. He has a follow up appointment in August with Dr Dominic Friendly.

## 2024-02-18 ENCOUNTER — Other Ambulatory Visit (HOSPITAL_COMMUNITY)

## 2024-03-07 ENCOUNTER — Telehealth: Payer: Self-pay | Admitting: Neurology

## 2024-03-07 NOTE — Telephone Encounter (Signed)
I am sorry to hear this.  Thank you for the notification.

## 2024-03-07 NOTE — Telephone Encounter (Signed)
 Pt's wife called in to cancel appointments, pt deceased. Sending sympathy card in mail

## 2024-03-14 DEATH — deceased

## 2024-03-21 ENCOUNTER — Ambulatory Visit: Admitting: Neurology

## 2024-04-19 ENCOUNTER — Ambulatory Visit: Admitting: Gastroenterology

## 2024-07-27 ENCOUNTER — Ambulatory Visit: Admitting: Neurology
